# Patient Record
Sex: Male | Born: 1990
Health system: Southern US, Community
[De-identification: ages and names within clinical notes are randomized; demographics above are authoritative.]

## PROBLEM LIST (undated history)

## (undated) DIAGNOSIS — K37 Unspecified appendicitis: Secondary | ICD-10-CM

## (undated) HISTORY — PX: APPENDECTOMY: SHX54

---
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
## 2009-07-09 IMAGING — CT CT PELVIS W/ CM
2 of 5 series · 17 of 46 positions shown, 19 images · IV contrast (APPLIED)
Comparison: None

CT ABDOMEN

CLINICAL DATA: Evaluate for appendicitis

CT ABDOMEN AND PELVIS WITH CONTRAST
TECHNIQUE: Multidetector CT imaging of the abdomen and pelvis was
performed using the standard protocol following bolus
administration of intravenous contrast.
Contrast: 125/7 125 ml omni 300

[Series 2: abd_pel 5.0 b40f st · axial · 0.56mm/px · z∈[-400,-45]mm · 14 of 79 slices shown, 16 images]
[im 4/79  soft-tissue]
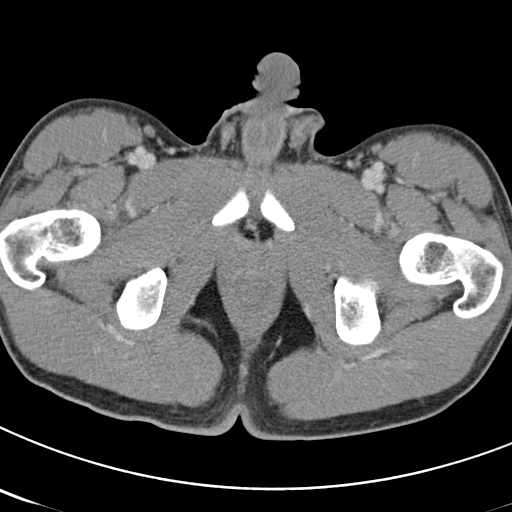
[im 4/79  bone]
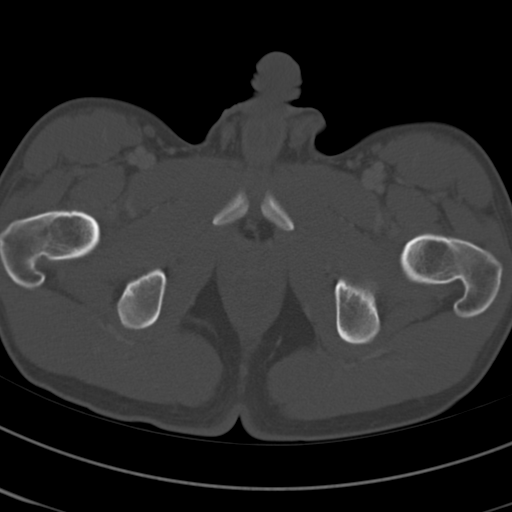
[im 12/79  soft-tissue]
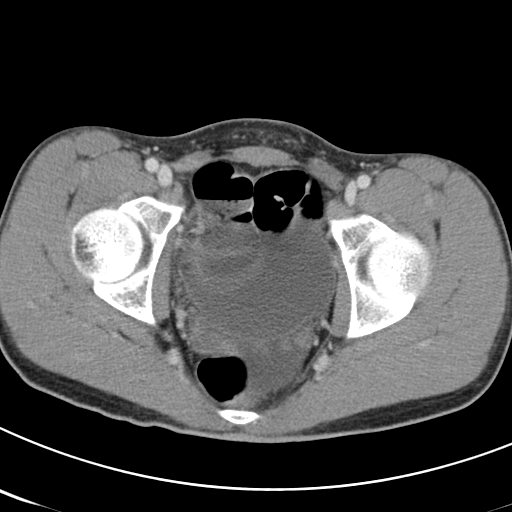
[im 16/79  soft-tissue]
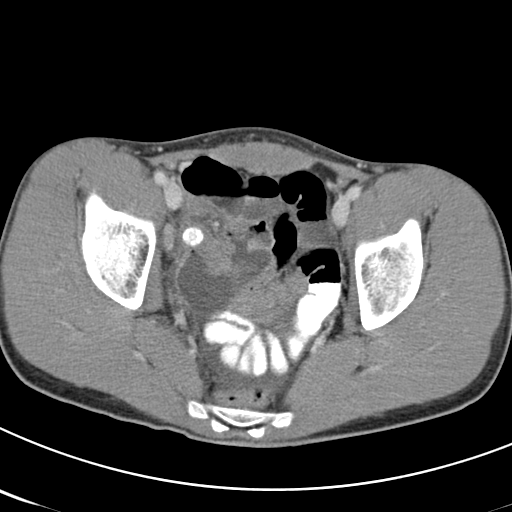
[im 20/79  soft-tissue]
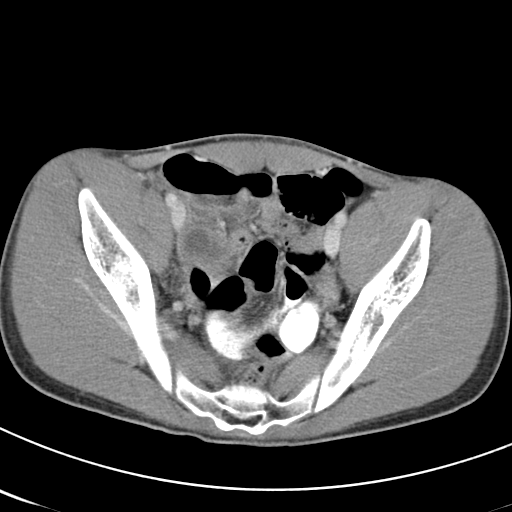
[im 28/79  soft-tissue]
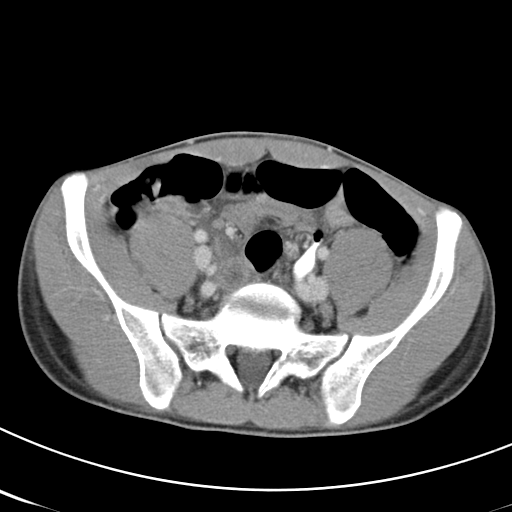
[im 32/79  soft-tissue]
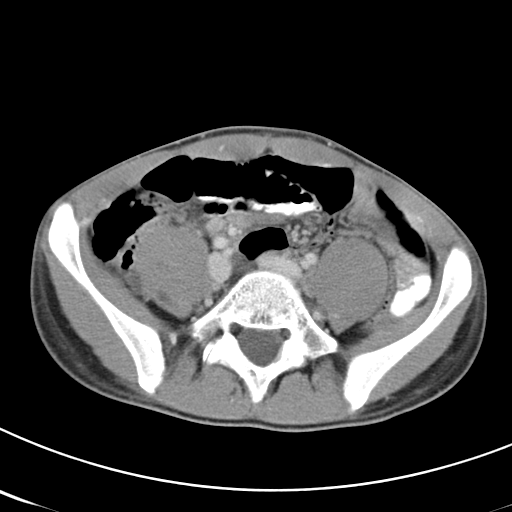
[im 36/79  soft-tissue]
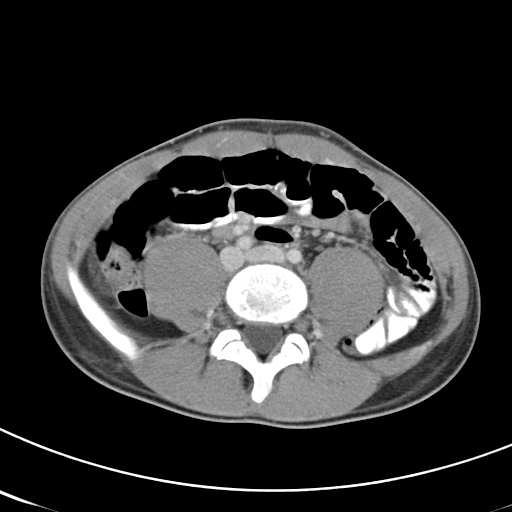
[im 43/79  soft-tissue]
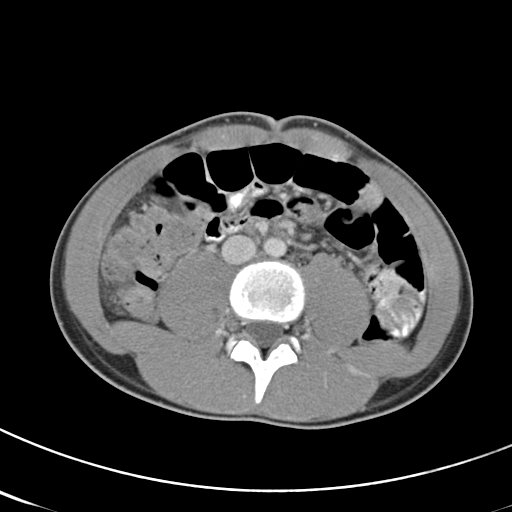
[im 47/79  soft-tissue]
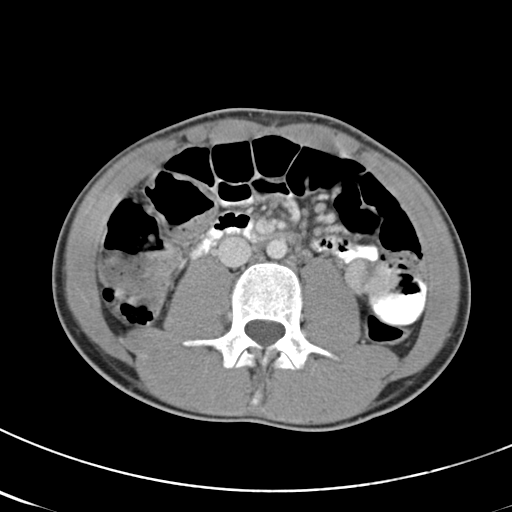
[im 47/79  bone]
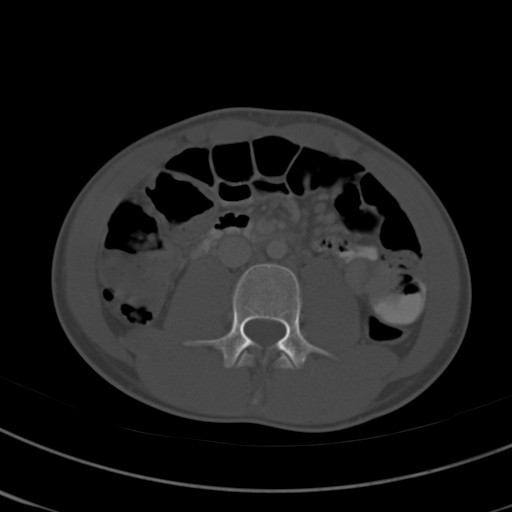
[im 51/79  soft-tissue]
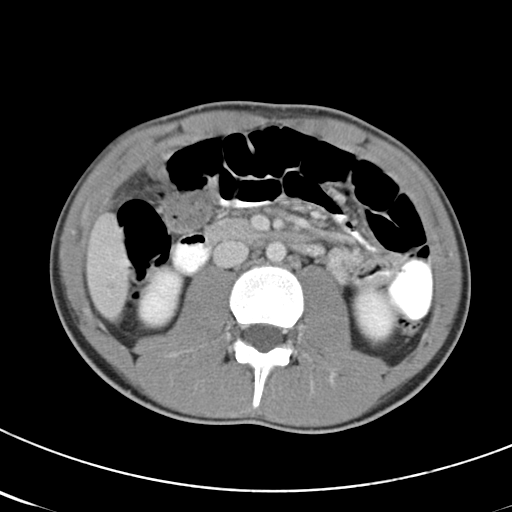
[im 59/79  soft-tissue]
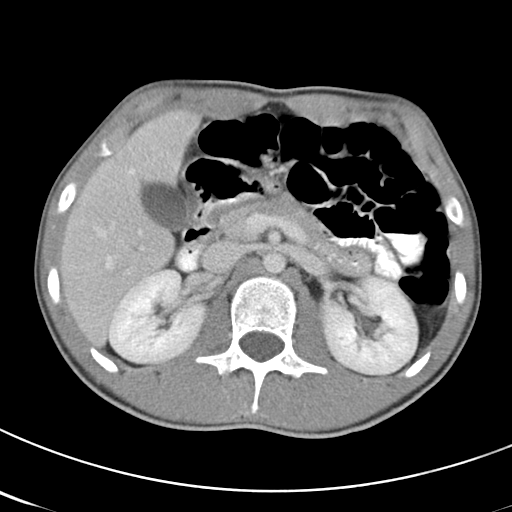
[im 63/79  soft-tissue]
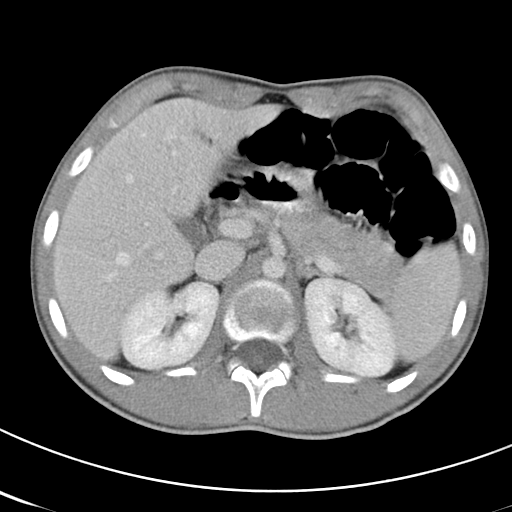
[im 67/79  soft-tissue]
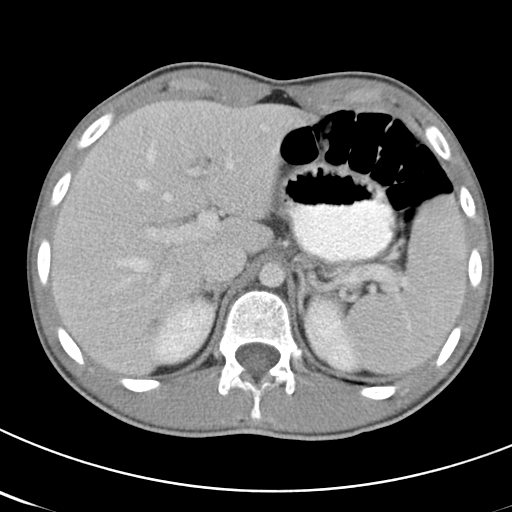
[im 75/79  soft-tissue]
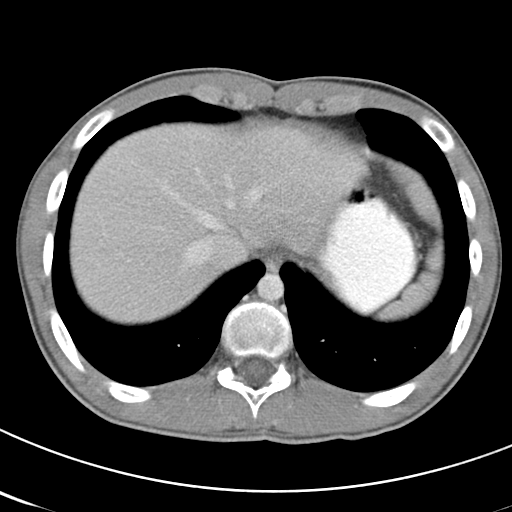

[Series 602: coronal abdomen · coronal · 0.80mm/px · 3 of 103 slices shown]
[im 35/103  soft-tissue]
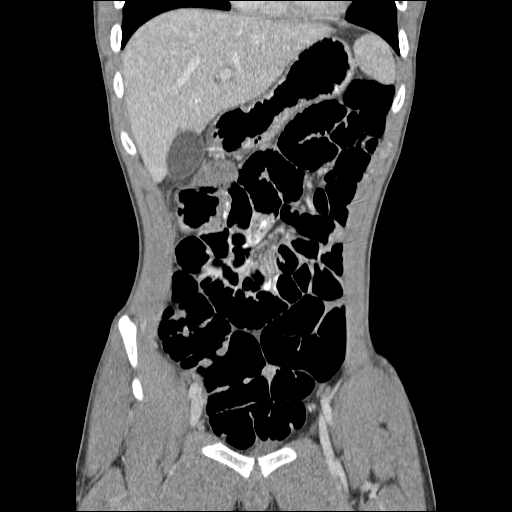
[im 46/103  soft-tissue]
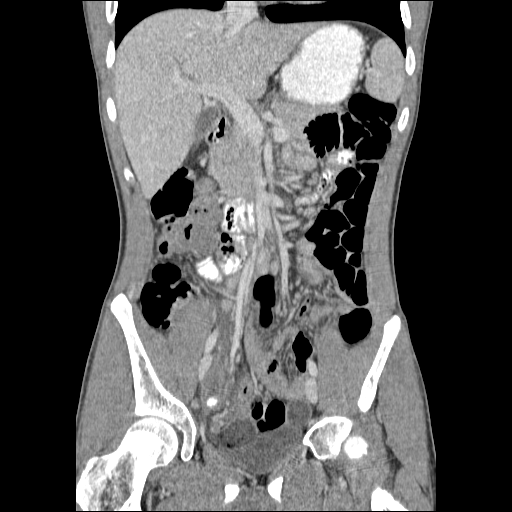
[im 57/103  soft-tissue]
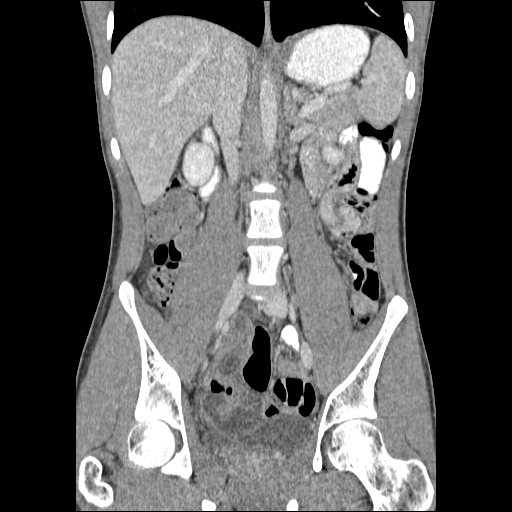

[17 of 46 positions shown; findings below may reference images not displayed]

FINDINGS: Visualized lung bases are clear.

Liver is normal.

Spleen is normal.

The pancreas is normal.

The adrenal glands are normal.

 The kidneys are normal.

Gallbladder is unremarkable by CT.

No biliary ductal dilation.

Stomach and visualized large and small bowel are unremarkable.

Abdominal aorta normal is in caliber.

No significant lymphadenopathy.

No free fluid or abnormal fluid collections..
IMPRESSION: No acute findings

CT PELVIS
FINDINGS: The appendix is diffusely dilated, fluid-filled and
contains a large appendicolith.  A moderate to marked amount of
free fluid is identified within the pelvis.  There is a low density
fluid collection posterior to the appendix within the right hemi
pelvis measuring approximately 4.2 x 3.6 cm.

The urinary bladder is negative.

Large bowel loops are unremarkable.

There is no adenopathy.

Review of the visualized osseous structures is unremarkable.
IMPRESSION: 1.  Acute appendicitis.
2.  Moderate amount of free fluid within the pelvis. I cannot rule
out a perforated appendicitis.  Additionally, there is a low
density fluid collection posterior to the appendix which may
represent a localized fluid collection/abscess.

## 2009-08-01 ENCOUNTER — Emergency Department (HOSPITAL_COMMUNITY): Admission: EM | Admit: 2009-08-01 | Discharge: 2009-08-01 | Payer: Self-pay | Admitting: Emergency Medicine

## 2010-09-09 LAB — URINALYSIS, ROUTINE W REFLEX MICROSCOPIC
Bilirubin Urine: NEGATIVE
Glucose, UA: NEGATIVE mg/dL
Hgb urine dipstick: NEGATIVE
Specific Gravity, Urine: 1.009 (ref 1.005–1.030)
pH: 6 (ref 5.0–8.0)

## 2010-09-09 LAB — BASIC METABOLIC PANEL
Calcium: 7.8 mg/dL — ABNORMAL LOW (ref 8.4–10.5)
Chloride: 97 mEq/L (ref 96–112)
Creatinine, Ser: 1.02 mg/dL (ref 0.4–1.5)
GFR calc Af Amer: >60 mL/min (ref >60)
Glucose, Bld: 106 mg/dL — ABNORMAL HIGH (ref 70–99)
Sodium: 129 mEq/L — ABNORMAL LOW (ref 135–145)

## 2010-09-09 LAB — DIFFERENTIAL
Basophils Absolute: 0 10*3/uL (ref 0.0–0.1)
Eosinophils Absolute: 0 10*3/uL (ref 0.0–0.7)
Eosinophils Relative: 0 % (ref 0–5)
Lymphs Abs: 0.8 10*3/uL (ref 0.7–4.0)

## 2010-09-09 LAB — CBC
MCHC: 34.3 g/dL (ref 30.0–36.0)
MCV: 84.5 fL (ref 78.0–100.0)
RDW: 13.3 % (ref 11.5–15.5)

## 2010-11-03 NOTE — H&P (Signed)
Paul Montes, Paul Montes              ACCOUNT NO.:  [id number]   MEDICAL RECORD NO.:  [id number]          PATIENT TYPE:  INP   LOCATION:  0104                         FACILITY:  [facility]   PHYSICIAN:  Adolph Pollack, M.D.DATE OF BIRTH:  1991-06-08   DATE OF ADMISSION:  01/07/2008  DATE OF DISCHARGE:                              HISTORY & PHYSICAL   CHIEF COMPLAINT:  Lower abdominal pain.   HISTORY OF PRESENT ILLNESS:  This is a 20 year old male who awoke about  3:00 in the morning of July 17 with lower abdominal pain.  The pain  persisted and was associated with intermittent fevers and chills.  He  had a small bowel movement.  No nausea or vomiting.  The pain got bad  enough that he presented to the emergency department today at  approximately 10:15 a.m.  He was noted on laboratory data to have  elevation of white blood cell count and was sent for a CT scan.  This  demonstrated findings consistent with acute appendicitis with possible  rupture.  There was a fluid collection in the deep pelvis concerning for  possible abscess versus reactive fluid.  I was asked to see him at that  time.   PAST MEDICAL HISTORY:  No chronic illnesses.   PAST SURGICAL HISTORY:  Previous operations none.   ALLERGIES:  PENICILLIN causes rash and hives.   MEDICATIONS:  Ibuprofen here for the pain recently.   SOCIAL HISTORY:  Lives at home with mother and father.  Not working this  summer.  No tobacco or alcohol use.   FAMILY HISTORY:  No chronic illnesses in his mother or father.   REVIEW OF SYSTEMS:  CARDIAC:  No hypertension or heart disease.  PULMONARY:  No asthma or pneumonia.  GI:  No peptic ulcer disease.  He  states he can only eat food in small amounts because he gets full  easily.  GU:  No kidney stones.  ENDOCRINE:  No diabetes.  HEMATOLOGIC:  No bleeding disorders or blood  clots.  NEUROLOGIC:  No seizures.   PHYSICAL EXAMINATION:  GENERAL:  A thin, ill-appearing male.  VITAL SIGNS:   Temperature is 98.5, blood pressure is 110/76, pulse 105,  respiratory rate 18, O2 saturations 99% on room air.  EYES:  Extraocular motions intact.  No icterus.  NECK:  Is supple without any palpable masses.  RESPIRATORY:  Breath sounds equal and clear respirations unlabored.  CARDIOVASCULAR:  Increased rate with regular rhythm.  ABDOMEN:  Abdomen is slightly firm.  He has some diffuse mild to  moderate lower abdominal tenderness.  No palpable masses.  No hernias.  MUSCULOSKELETAL:  He has adequate muscle tone.  No edema.  SKIN:  Pale but no jaundice.   White blood cell count 16,700, hemoglobin 16.0.  CT scan was reviewed.   IMPRESSION:  Acute appendicitis with possible perforation and deep  pelvic abscess.   PLAN:  Laparoscopic, possible open appendectomy.  I have discussed the  procedure, rationale and risks with him and his parents.  Risks  including but not limited to bleeding, infection, wound healing  problems, anesthesia,  accidental damage to intra-abdominal organs (such  as bladder, intestine, etc.) and potential increase for recurrent  infections.  They seem to understand this and agree with the plan.      Adolph Pollack, M.D.  Electronically Signed     TJR/MEDQ  D:  01/07/2008  T:  01/07/2008  Job:  [id number]

## 2010-11-03 NOTE — Op Note (Signed)
Paul Montes, Paul Montes              ACCOUNT NO.:  [id number]   MEDICAL RECORD NO.:  [id number]          PATIENT TYPE:  INP   LOCATION:  1540                         FACILITY:  [facility]   PHYSICIAN:  Adolph Pollack, M.D.DATE OF BIRTH:  12/06/1990   DATE OF PROCEDURE:  01/07/2008  DATE OF DISCHARGE:                               OPERATIVE REPORT   PREOP DIAGNOSIS:  Acute appendicitis.   POSTOP DIAGNOSIS:  Acute appendicitis.   PROCEDURE:  Laparoscopic appendectomy.   SURGEON:  Adolph Pollack, M.D.   ANESTHESIA:  General.   INDICATION:  This 20 year old male came to emergency department  complaining of worsening lower abdominal pain, some fever and chills and  a CT scan consistent with acute appendicitis and a questionable rupture  and abscess.  He is now brought to the operating room for appendectomy.   TECHNIQUE:  He is brought to the operating room, placed supine on the  operating table and a general anesthetic was administered.  A Foley  catheter was placed.  The hair on the abdominal wall was clipped and the  area sterilely prepped and draped.  Marcaine was infiltrated in the  subumbilical region.  A subumbilical incision was then made through the  skin, subcutaneous tissue, fascial layers and peritoneum, entering the  peritoneal cavity under direct vision.  A pursestring suture of #0  Vicryl was placed around the fascial edges.  A Hasson trocar was  introduced into the peritoneal cavity and pneumoperitoneum created by  insufflation of CO2 gas.   Next, the laparoscope introduced.  I then placed a 5 mm trocar in the  left lower quadrant.  I looked in the pelvis and all I saw here was  cloudy fluid and no real abscess.  I drained the cloudy fluid by way of  suction.  I then visualized the appendix.  The tip of it was edematous  and inflamed but did not appear to be ruptured.  It was fairly densely  adherent to the retroperitoneum near where the ureter would be  traveling.  Using careful blunt dissection, I was able to lift it off  the retroperitoneum and then identified the ureter, I was able to keep  my plane of dissection anterior to this.  Then I added a 5 mm trocar to  the right upper quadrant.  I was able to use blunt dissection to  separate the appendix from its adherence to the distal ileum secondary  to inflammatory tissue.  I then divided the mesoappendix close to the  appendix with a harmonic scalpel.  The proximal appendix had some  significant attachments to the lateral sidewall which I divided with the  harmonic scalpel.  I was then able to get down to the base of the  appendix and its intersection with the cecum.  The mesoappendix was  quite thick.   I retracted the appendix anteriorly and amputated it off the cecum with  the Endo-GIA stapler.  I then placed the appendix in an Endopouch bag.   I then removed the appendix through the subumbilical incision, but I had  to enlarge the fascial  incision given the size of the appendix.  This  was then sent to pathology.   Following this, I replaced the 12 mm subumbilical trocar.  I then  copiously irrigated out the right lower quadrant pelvis area.  Some  bleeding was noted from the staple line which was controlled with  hemoclips.  I inspected the ureter and intestines and no injuries were  noted, the area was hemostatic and the staple line was solid.   I then evacuated the irrigation fluid as much as possible.  I removed  the subumbilical trocar and then closed the subumbilical fascial defect  with interrupted #0 Vicryl sutures using laparoscopic vision.  I then  removed the remaining trocars and released pneumoperitoneum.   The skin incisions were then closed with #4-0 Monocryl subcuticular  stitches followed by Steri-Strips and sterile dressings.  He tolerated  procedure well without any apparent complications and was taken to  recovery in satisfactory condition.      Adolph Pollack, M.D.  Electronically Signed     TJR/MEDQ  D:  01/07/2008  T:  01/07/2008  Job:  [id number]

## 2011-03-19 LAB — DIFFERENTIAL
Basophils Absolute: 0.1
Basophils Relative: 0
Lymphs Abs: 1.9

## 2011-03-19 LAB — CBC
Hemoglobin: 16
MCHC: 34.1
Platelets: 289
RBC: 5.58
RDW: 13.5

## 2011-03-19 LAB — URINALYSIS, ROUTINE W REFLEX MICROSCOPIC
Glucose, UA: NEGATIVE
Leukocytes, UA: NEGATIVE
Nitrite: NEGATIVE
Protein, ur: NEGATIVE
Specific Gravity, Urine: 1.025
Urobilinogen, UA: 0.2
pH: 5.5

## 2011-03-19 LAB — POCT I-STAT, CHEM 8
Calcium, Ion: 1.11 — ABNORMAL LOW
HCT: 50 — ABNORMAL HIGH
Hemoglobin: 17 — ABNORMAL HIGH
Sodium: 136
TCO2: 27

## 2011-11-24 ENCOUNTER — Encounter (HOSPITAL_COMMUNITY): Payer: Self-pay | Admitting: *Deleted

## 2011-11-24 ENCOUNTER — Emergency Department (HOSPITAL_COMMUNITY)
Admission: EM | Admit: 2011-11-24 | Discharge: 2011-11-24 | Disposition: A | Discharge status: Home / Self Care | Payer: 59 | Source: Home / Self Care | Attending: Emergency Medicine | Admitting: Emergency Medicine

## 2011-11-24 DIAGNOSIS — M5380 Other specified dorsopathies, site unspecified: Secondary | ICD-10-CM

## 2011-11-24 DIAGNOSIS — M62830 Muscle spasm of back: Secondary | ICD-10-CM

## 2011-11-24 HISTORY — DX: Unspecified appendicitis: K37

## 2011-11-24 LAB — POCT URINALYSIS DIP (DEVICE)
Hgb urine dipstick: NEGATIVE
Ketones, ur: 15 mg/dL — AB
Leukocytes, UA: NEGATIVE
Protein, ur: NEGATIVE mg/dL
Specific Gravity, Urine: 1.02 (ref 1.005–1.030)
Urobilinogen, UA: 0.2 mg/dL (ref 0.0–1.0)

## 2011-11-24 MED ORDER — METAXALONE 800 MG PO TABS: 800.0000 mg | ORAL_TABLET | Freq: Three times a day (TID) | ORAL | Status: AC

## 2011-11-24 MED ORDER — HYDROCODONE-ACETAMINOPHEN 5-325 MG PO TABS: 2.0000 | ORAL_TABLET | ORAL | Status: AC | PRN

## 2011-11-24 MED ORDER — MELOXICAM 15 MG PO TABS: 15.0000 mg | ORAL_TABLET | Freq: Every day | ORAL | Status: DC

## 2011-11-24 NOTE — Discharge Instructions (Signed)
[facility]: [address] [place] [address] [phone]  [facility]: [address] [place] [address]   [phone]  [facility]: [address] [place] [address] [phone]  [facility] : [address] [place] [address] [phone]  [facility]: [address] [place] [address] [phone]  [facility]: [address] [place] [address] [phone]  Dr. Oneal Grout [address] [facility] [place] [address] [phone]     Spasticity Spasticity is a condition in which certain muscles contract continuously. This causes stiffness or tightness of the muscles. It may interfere with movement, speech, and manner of walking. CAUSES  This condition is usually caused by damage to the portion of the brain or spinal cord that controls voluntary movement. It may occur in association with:  Spinal cord injury.   Multiple sclerosis.   Cerebral palsy.   Brain damage due to lack of oxygen.   Brain trauma.   Severe head injury.   Metabolic diseases such as:   Adrenoleukodystrophy.   ALS Truddie Hidden Gehrig's disease).   Phenylketonuria.  SYMPTOMS   Increased muscle tone (hypertonicity).   A series of rapid muscle contractions (clonus).   Exaggerated deep tendon reflexes.   Muscle spasms.   Involuntary crossing of the legs (scissoring).   Fixed joints.  The degree of spasticity varies. It ranges from mild muscle stiffness to severe, painful, and uncontrollable muscle spasms. It can interfere with rehabilitation in patients with certain disorders. It often interferes with daily activities. TREATMENT  Treatment may include:  Medications.   Physical therapy regimens. They may include muscle  stretching and range of motion exercises. These help prevent shrinkage or shortening of muscles. They also help reduce the severity of symptoms.   Surgery. This may be recommended for tendon release or to sever the nerve-muscle pathway.  PROGNOSIS  The outcome for those with spasticity depends on:  Severity of the spasticity.   Associated disorder(s).  Document Released: 05/28/2002 Document Revised: 05/27/2011 Document Reviewed: 06/07/2005 [facility] Patient Information 2012 [place], [place].

## 2011-11-24 NOTE — ED Notes (Signed)
Pt  Has  Symptoms  Of  r  Upper  Quadrant  Pain      Radiating  To  Back     And  r  Shoulder  Area    -  Symptoms   Started  Last    Pm  No  Injury         -        Some nausea  No  Vomiting        Ambulated  To  Room with  A  Fluid  Steady  Gait

## 2011-11-24 NOTE — ED Provider Notes (Signed)
History     CSN: [id number]  Arrival date & time 11/24/11  1114   First MD Initiated Contact with Patient 11/24/11 1126      Chief Complaint  Patient presents with  . Abdominal Pain    (Consider location/radiation/quality/duration/timing/severity/associated sxs/prior treatment) HPI Comments: Patient reports right sided mid thoracic pain starting last night. Radiates around to the midaxillary line. Describes as achy and similar to muscle spasms. Pain is worse with taking deep inspiration, torso rotation, lateral bending, bending forwards. Is not affected with eating or fasting. no nausea, vomiting, fevers, coughing, wheezing, abdominal pain. No rash. No constipation or diarrhea. No recent or remote history of injury to his neck or back. Does not recall any change in his physical activity. Took 400 mg ibuprofen with some relief yesterday. No history of cancer, IVDU, osteoporosis, prolonged steroid use.  Patient is a 21 y.o. male presenting with back pain. The history is provided by the patient. No language interpreter was used.  Back Pain  This is a new problem. The current episode started yesterday. The problem occurs constantly. The pain is associated with no known injury. The pain is present in the thoracic spine. The quality of the pain is described as aching and stabbing. The symptoms are aggravated by bending, twisting and certain positions. The pain is worse during the day. Pertinent negatives include no fever, no numbness, no abdominal pain, no abdominal swelling, no bowel incontinence, no bladder incontinence, no dysuria, no leg pain, no paresthesias, no tingling and no weakness. He has tried NSAIDs for the symptoms. The treatment provided moderate relief.    Past Medical History  Diagnosis Date  . Appendicitis     Past Surgical History  Procedure Date  . Appendectomy     History reviewed. No pertinent family history.  History  Substance Use Topics  . Smoking status: Not on  file  . Smokeless tobacco: Not on file  . Alcohol Use:       Review of Systems  Constitutional: Negative for fever.  Gastrointestinal: Negative for abdominal pain and bowel incontinence.  Genitourinary: Negative for bladder incontinence and dysuria.  Musculoskeletal: Positive for back pain.  Neurological: Negative for tingling, weakness, numbness and paresthesias.    Allergies  Penicillins  Home Medications   Current Outpatient Rx  Name Route Sig Dispense Refill  . HYDROCODONE-ACETAMINOPHEN 5-325 MG PO TABS Oral Take 2 tablets by mouth every 4 (four) hours as needed for pain. 20 tablet 0  . MELOXICAM 15 MG PO TABS Oral Take 1 tablet (15 mg total) by mouth daily. 14 tablet 0  . METAXALONE 800 MG PO TABS Oral Take 1 tablet (800 mg total) by mouth 3 (three) times daily. 21 tablet 0    BP 132/89  Pulse 100  Temp(Src) 98.6 F (37 C) (Oral)  Resp 12  SpO2 100%  Physical Exam  Nursing note and vitals reviewed. Constitutional: He is oriented to person, place, and time. He appears well-developed and well-nourished.  HENT:  Head: Normocephalic and atraumatic.  Eyes: Conjunctivae and EOM are normal.  Neck: Normal range of motion.  Cardiovascular: Normal rate, regular rhythm, normal heart sounds and intact distal pulses.   Pulmonary/Chest: Effort normal and breath sounds normal. No respiratory distress. He exhibits no tenderness.  Abdominal: Normal appearance and bowel sounds are normal. He exhibits no distension. There is no tenderness. There is no rigidity, no rebound, no guarding, no CVA tenderness, no tenderness at McBurney's point and negative Murphy's sign.  Musculoskeletal: Normal range  of motion.       Thoracic back: He exhibits tenderness and spasm. He exhibits normal range of motion, no bony tenderness and no deformity.       Back:       Painless full range of motion bilateral shoulders. Patient's symptoms are aggravated with active bending to the right, torso rotation.  No rash.  Neurological: He is alert and oriented to person, place, and time.  Skin: Skin is warm and dry.  Psychiatric: He has a normal mood and affect. His behavior is normal.    ED Course  Procedures (including critical care time)  Labs Reviewed  POCT URINALYSIS DIP (DEVICE) - Abnormal; Notable for the following:    Bilirubin Urine SMALL (*)    Ketones, ur 15 (*)    All other components within normal limits   No results found.   1. Muscle spasm of back     MDM  H&P most consistent with rhomboid/trapezial muscle spasm. Abdomen benign. No evidence of cholelithiasis, pneumonia. No evidence of infection, hematuria on udip. Will send home with muscle relaxants, NSAIDs, Norco as needed. He'll followup with a primary care physician of choice. Providing with local primary care resources.  Luiz Blare, MD 11/24/11 2122

## 2012-06-17 ENCOUNTER — Encounter (HOSPITAL_COMMUNITY): Payer: Self-pay | Admitting: Emergency Medicine

## 2012-06-17 ENCOUNTER — Emergency Department (HOSPITAL_COMMUNITY)
Admission: EM | Admit: 2012-06-17 | Discharge: 2012-06-17 | Disposition: A | Discharge status: Home / Self Care | Payer: 59 | Attending: Emergency Medicine | Admitting: Emergency Medicine

## 2012-06-17 DIAGNOSIS — R112 Nausea with vomiting, unspecified: Secondary | ICD-10-CM | POA: Insufficient documentation

## 2012-06-17 DIAGNOSIS — Z8719 Personal history of other diseases of the digestive system: Secondary | ICD-10-CM | POA: Insufficient documentation

## 2012-06-17 DIAGNOSIS — E860 Dehydration: Secondary | ICD-10-CM | POA: Insufficient documentation

## 2012-06-17 LAB — COMPREHENSIVE METABOLIC PANEL
ALT: 29 U/L (ref 0–53)
AST: 24 U/L (ref 0–37)
Albumin: 4.7 g/dL (ref 3.5–5.2)
Alkaline Phosphatase: 70 U/L (ref 39–117)
CO2: 23 mEq/L (ref 19–32)
Glucose, Bld: 112 mg/dL — ABNORMAL HIGH (ref 70–99)
Potassium: 3.7 mEq/L (ref 3.5–5.1)
Total Protein: 7.9 g/dL (ref 6.0–8.3)

## 2012-06-17 LAB — CBC WITH DIFFERENTIAL/PLATELET
Basophils Relative: 0 % (ref 0–1)
Eosinophils Absolute: 0 10*3/uL (ref 0.0–0.7)
Eosinophils Relative: 0 % (ref 0–5)
HCT: 50.4 % (ref 39.0–52.0)
Lymphocytes Relative: 6 % — ABNORMAL LOW (ref 12–46)
Lymphs Abs: 1.1 10*3/uL (ref 0.7–4.0)
MCH: 28.9 pg (ref 26.0–34.0)
Monocytes Absolute: 0.8 10*3/uL (ref 0.1–1.0)
Monocytes Relative: 4 % (ref 3–12)
Neutrophils Relative %: 89 % — ABNORMAL HIGH (ref 43–77)
RDW: 12.4 % (ref 11.5–15.5)
WBC: 17.8 10*3/uL — ABNORMAL HIGH (ref 4.0–10.5)

## 2012-06-17 MED ORDER — ONDANSETRON HCL 4 MG/2ML IJ SOLN
4.0000 mg | Freq: Once | INTRAMUSCULAR | Status: AC
  Administered 2012-06-17: 4 mg via INTRAVENOUS
  Filled 2012-06-17: qty 2

## 2012-06-17 MED ORDER — PANTOPRAZOLE SODIUM 40 MG IV SOLR
40.0000 mg | Freq: Once | INTRAVENOUS | Status: AC
  Administered 2012-06-17: 40 mg via INTRAVENOUS
  Filled 2012-06-17: qty 40

## 2012-06-17 MED ORDER — ONDANSETRON HCL 8 MG PO TABS: 8.0000 mg | ORAL_TABLET | Freq: Three times a day (TID) | ORAL | Status: DC | PRN

## 2012-06-17 MED ORDER — MORPHINE SULFATE 4 MG/ML IJ SOLN
4.0000 mg | Freq: Once | INTRAMUSCULAR | Status: AC
  Administered 2012-06-17: 4 mg via INTRAVENOUS
  Filled 2012-06-17: qty 1

## 2012-06-17 MED ORDER — SODIUM CHLORIDE 0.9 % IV BOLUS (SEPSIS)
1000.0000 mL | Freq: Once | INTRAVENOUS | Status: AC
  Administered 2012-06-17: 1000 mL via INTRAVENOUS

## 2012-06-17 NOTE — ED Notes (Signed)
MD at bedside. 

## 2012-06-17 NOTE — ED Notes (Signed)
Pt states that he has been throwing up since 0500 today.  Denies diarrhea.  No abd pain.  Pt's dad had same sx yesterday.

## 2012-06-17 NOTE — ED Notes (Signed)
Discussed HR of 110-120s with Dr. Denton Lank. No further orders. Pt stable to go home.

## 2012-06-17 NOTE — ED Provider Notes (Signed)
History     CSN: [id number]  Arrival date & time 06/17/12  1041   First MD Initiated Contact with Patient 06/17/12 1143      Chief Complaint  Patient presents with  . Nausea  . Emesis    (Consider location/radiation/quality/duration/timing/severity/associated sxs/prior treatment) The history is provided by the patient.  pt c/o nv onset early this am. Several episodes. Emesis clear to sl yellowish. No bloody or bilious emesis. Had loose bm today. No recent constipation. Intermittent mid abd crampy abd discomfort. No constant, focal abd pain. No fever or chills. Parent and gp w recently similar symptoms. No known bad food ingestion, no recent abx use, or travel. No cough or uri c/o. No gu c/o. Prior abd surgery includes appendectomy, having regular bms, no abd distension.      Past Medical History  Diagnosis Date  . Appendicitis     Past Surgical History  Procedure Date  . Appendectomy     History reviewed. No pertinent family history.  History  Substance Use Topics  . Smoking status: Never Smoker   . Smokeless tobacco: Not on file  . Alcohol Use: No      Review of Systems  Constitutional: Negative for fever.  HENT: Negative for sore throat and neck pain.   Eyes: Negative for redness.  Respiratory: Negative for cough and shortness of breath.   Cardiovascular: Negative for chest pain.  Gastrointestinal: Positive for vomiting.  Genitourinary: Negative for flank pain.  Musculoskeletal: Negative for back pain.  Skin: Negative for rash.  Neurological: Negative for headaches.  Hematological: Does not bruise/bleed easily.  Psychiatric/Behavioral: Negative for confusion.    Allergies  Penicillins  Home Medications  No current outpatient prescriptions on file.  BP 127/82  Pulse 113  Temp 98.7 F (37.1 C) (Oral)  Resp 16  SpO2 97%  Physical Exam  Nursing note and vitals reviewed. Constitutional: He is oriented to person, place, and time. He appears  well-developed and well-nourished. No distress.  HENT:  Head: Atraumatic.  Nose: Nose normal.  Mouth/Throat: Oropharynx is clear and moist.  Eyes: Conjunctivae normal are normal. No scleral icterus.  Neck: Neck supple. No tracheal deviation present.  Cardiovascular: Normal rate, regular rhythm, normal heart sounds and intact distal pulses.   Pulmonary/Chest: Effort normal and breath sounds normal. No accessory muscle usage. No respiratory distress.  Abdominal: Soft. Bowel sounds are normal. He exhibits no distension and no mass. There is no tenderness. There is no rebound and no guarding.  Genitourinary:       No cva tenderness  Musculoskeletal: Normal range of motion. He exhibits no edema and no tenderness.  Neurological: He is alert and oriented to person, place, and time.  Skin: Skin is warm and dry.  Psychiatric: He has a normal mood and affect.    ED Course  Procedures (including critical care time)  Results for orders placed during the hospital encounter of 06/17/12  CBC WITH DIFFERENTIAL      Component Value Range   WBC 17.8 (*) 4.0 - 10.5 K/uL   RBC 6.20 (*) 4.22 - 5.81 MIL/uL   Hemoglobin 17.9 (*) 13.0 - 17.0 g/dL   HCT 14.7  82.9 - 56.2 %   MCV 81.3  78.0 - 100.0 fL   MCH 28.9  26.0 - 34.0 pg   MCHC 35.5  30.0 - 36.0 g/dL   RDW 13.0  86.5 - 78.4 %   Platelets 319  150 - 400 K/uL   Neutrophils Relative 89 (*)  43 - 77 %   Neutro Abs 15.9 (*) 1.7 - 7.7 K/uL   Lymphocytes Relative 6 (*) 12 - 46 %   Lymphs Abs 1.1  0.7 - 4.0 K/uL   Monocytes Relative 4  3 - 12 %   Monocytes Absolute 0.8  0.1 - 1.0 K/uL   Eosinophils Relative 0  0 - 5 %   Eosinophils Absolute 0.0  0.0 - 0.7 K/uL   Basophils Relative 0  0 - 1 %   Basophils Absolute 0.0  0.0 - 0.1 K/uL  COMPREHENSIVE METABOLIC PANEL      Component Value Range   Sodium 138  135 - 145 mEq/L   Potassium 3.7  3.5 - 5.1 mEq/L   Chloride 102  96 - 112 mEq/L   CO2 23  19 - 32 mEq/L   Glucose, Bld 112 (*) 70 - 99 mg/dL    BUN 10  6 - 23 mg/dL   Creatinine, Ser 1.61  0.50 - 1.35 mg/dL   Calcium 9.4  8.4 - 09.6 mg/dL   Total Protein 7.9  6.0 - 8.3 g/dL   Albumin 4.7  3.5 - 5.2 g/dL   AST 24  0 - 37 U/L   ALT 29  0 - 53 U/L   Alkaline Phosphatase 70  39 - 117 U/L   Total Bilirubin 0.3  0.3 - 1.2 mg/dL   GFR calc non Af Amer >90  >90 mL/min   GFR calc Af Amer >90  >90 mL/min     MDM  Iv ns bolus. zofran iv. protonix iv. Morphine iv.   Reviewed nursing notes and prior charts for additional history.    Pt feels mildly improved. Additional iv ns bolus.   abd soft nt on recheck.  Recheck hr 84, rr 16. No recurrent emesis. Pt appears stable for d/c.         Suzi Roots, MD 06/17/12 [phone]

## 2013-01-22 ENCOUNTER — Other Ambulatory Visit: Payer: Self-pay | Admitting: Dermatology

## 2015-03-18 ENCOUNTER — Other Ambulatory Visit: Payer: Self-pay | Admitting: Family Medicine

## 2015-03-18 DIAGNOSIS — N50811 Right testicular pain: Secondary | ICD-10-CM

## 2016-02-21 DIAGNOSIS — H578 Other specified disorders of eye and adnexa: Secondary | ICD-10-CM | POA: Diagnosis not present

## 2016-05-14 DIAGNOSIS — R112 Nausea with vomiting, unspecified: Secondary | ICD-10-CM | POA: Diagnosis not present

## 2016-05-14 DIAGNOSIS — A084 Viral intestinal infection, unspecified: Secondary | ICD-10-CM | POA: Diagnosis not present

## 2017-04-14 ENCOUNTER — Encounter: Payer: Self-pay | Admitting: Family Medicine

## 2017-04-14 ENCOUNTER — Ambulatory Visit (INDEPENDENT_AMBULATORY_CARE_PROVIDER_SITE_OTHER): Payer: BLUE CROSS/BLUE SHIELD | Admitting: Family Medicine

## 2017-04-14 VITALS — BP 104/70 | HR 92 | Temp 98.2°F | Ht 67.0 in | Wt 132.0 lb

## 2017-04-14 DIAGNOSIS — F649 Gender identity disorder, unspecified: Secondary | ICD-10-CM | POA: Diagnosis not present

## 2017-04-14 DIAGNOSIS — E349 Endocrine disorder, unspecified: Secondary | ICD-10-CM

## 2017-04-14 MED ORDER — SPIRONOLACTONE 25 MG PO TABS: 25.0000 mg | ORAL_TABLET | Freq: Every day | ORAL | 3 refills | Status: DC

## 2017-04-14 NOTE — Patient Instructions (Signed)
Start the spironolactone daily. You may crush it into food (pudding, applesauce, pretty much anything) I will send you a note about your lab work  That you have drawn today. Please come at your conveneince in about a week (after starting the spironolactone) for a lab only visit. You do not need to fast for that.  I will see you back as we discussed --21st of next month. Please review the consent form I have given you and bring it back with you, Please call with any questions or issues. It was great to meet you!

## 2017-04-15 ENCOUNTER — Encounter: Payer: Self-pay | Admitting: Family Medicine

## 2017-04-15 NOTE — Assessment & Plan Note (Signed)
Review of trans-gender intake form. Scanned to chart. Discussion about feminizing therapy. He was to continue currently going by the name Paul Montes. Ultimately may change to [name]. Had some hesitations about spurlike time because he had an episode of dehydration requiring hospitalization related to the influenza virus a few years ago. We discussed at length. Informed consent model discussed. Spurlike time started. Baseline labs today. She'll get repeat potassium and creatinine level in about 1 week. When I see him back we'll discuss next steps. Greater than 50% of our 45 minute office visit was spent in counseling and education regarding these issues.

## 2017-04-15 NOTE — Progress Notes (Signed)
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 First age when gender identity did not seem to match sex given of birth was a 6 or less. Used to dressing his sisters close but knew he should not let his parents know that. For many years she thought that was just a "fetish". Currently a senior at [employer] Has been seeing a counselor there for about a year. This is been beneficial. Has helped him work through a lot of confusion. About a month ago he decided he wanted to pursue transition to trans-woman. Right now he is living is a male, continues to use his name given at birth. Does not plan to change that until he begins a more formal transition process such as hormone therapy. He believes it will be quite a shock to his family. He has discussed that with them. I told him, they will not kick him out of the family but there essentially not happy about his decision.  Generally has good health area one overnight hospitalization for dehydration related to the influenza virus. This concerns him about taking diuretic therapy.  Occasionally feels down and depressed but never suicidal. Since he started in therapy, the feelings of depression are much less. He is hopeful that this transition would significantly improve his self-image. Occasionally has some mild anxiety and that she is associated with some mild nausea. This happens to 3 times a year. He uses some ondansetron for that and it resolves quickly.  Does not use alcohol, no illicits. Does not exercise regularly. Occasionally takes over-the-counter ibuprofen.  Goals for hormone therapy included feminization of his body. He is aware of permanent effects of hormone treatment such as breast development and loss of fertility. Ultimately would like to consider vaginoplasty.  Support systems include his group of friends, he is also active in a [employer] group at school area  is therapist has been extremely supportive. He thinks his family ultimately will be supportive but they will probably have to go through a lot of "growing pains"  REVIEW OF SYSTEMS:  Review of Systems  Constitutional: Negative for activity chang; no  appetite change and no unexpected weight change.  Eyes: Negative for eye pain and no visual disturbance.  Neck: denies neck pain; no swallowing problems CV: No chest pain, no shortness of breath, no lower extremity edema. No change in exercise tolerance Respiratory: Negative for cough or wheezing.  No shortness of breath. Gastrointestinal: Negative for abdominal pain, no diarrhea and no  constipation.  Genitourinary: Negative for decreased urine volume and  no difficulty urinating.  Musculoskeletal: Negative for arthralgias. No muscle weakness. Skin: Negative for rash.  Psychiatric/Behavioral: Negative for behavioral problems; no sleep disturbance and no  agitation.     PERTINENT  PMH / PSH: I have reviewed the patient's medications, allergies, past medical and surgical history, smoking status and updated in the EMR as appropriate.   OBJECTIVE:   Vital signs reviewed. GENERAL: Well-developed, well-nourished, no acute distress. CARDIOVASCULAR: Regular rate and rhythm  LUNGS: Normal resting effort ABDOMEN: Soft positive bowel sounds NEURO: No gross focal neurological deficits. MSK: Movement of extremity x 4. PSYCHIATRIC: Alert and oriented 4. Affect is interactive. Good eye contact and neatly dressed. Speech is normal fluency in content. Recent and remote memory is intact. Judgment is normal. No hallucinations. No psychomotor retardation and no agitation.    ASSESSMENT / PLAN: Please see problem oriented charting for  details

## 2017-04-16 LAB — CMP14+EGFR
A/G RATIO: 2 (ref 1.2–2.2)
ALBUMIN: 4.3 g/dL (ref 3.5–5.5)
ALT: 25 IU/L (ref 0–44)
AST: 16 IU/L (ref 0–40)
Alkaline Phosphatase: 63 IU/L (ref 39–117)
BILIRUBIN TOTAL: 0.4 mg/dL (ref 0.0–1.2)
BUN / CREAT RATIO: 10 (ref 9–20)
BUN: 9 mg/dL (ref 6–20)
CALCIUM: 9.3 mg/dL (ref 8.7–10.2)
CHLORIDE: 103 mmol/L (ref 96–106)
CO2: 24 mmol/L (ref 20–29)
Creatinine, Ser: 0.87 mg/dL (ref 0.76–1.27)
GFR, EST AFRICAN AMERICAN: 138 mL/min/{1.73_m2} (ref >59)
GFR, EST NON AFRICAN AMERICAN: 119 mL/min/{1.73_m2} (ref >59)
GLOBULIN, TOTAL: 2.2 g/dL (ref 1.5–4.5)
Glucose: 87 mg/dL (ref 65–99)
POTASSIUM: 4.4 mmol/L (ref 3.5–5.2)
SODIUM: 142 mmol/L (ref 134–144)
TOTAL PROTEIN: 6.5 g/dL (ref 6.0–8.5)

## 2017-04-16 LAB — TESTOSTERONE, FREE, TOTAL, SHBG
SEX HORMONE BINDING: 45.7 nmol/L (ref 16.5–55.9)
Testosterone, Free: 12 pg/mL (ref 9.3–26.5)
Testosterone: 518 ng/dL (ref 264–916)

## 2017-04-16 LAB — CBC
HEMATOCRIT: 47.4 % (ref 37.5–51.0)
Hemoglobin: 16.3 g/dL (ref 13.0–17.7)
MCH: 28.2 pg (ref 26.6–33.0)
MCHC: 34.4 g/dL (ref 31.5–35.7)
MCV: 82 fL (ref 79–97)
PLATELETS: 321 10*3/uL (ref 150–379)
RBC: 5.79 x10E6/uL (ref 4.14–5.80)
RDW: 13.6 % (ref 12.3–15.4)
WBC: 5.7 10*3/uL (ref 3.4–10.8)

## 2017-04-16 LAB — HIV ANTIBODY (ROUTINE TESTING W REFLEX): HIV SCREEN 4TH GENERATION: NONREACTIVE

## 2017-04-16 LAB — ESTRADIOL: Estradiol: 20.6 pg/mL (ref 7.6–42.6)

## 2017-04-18 ENCOUNTER — Telehealth: Payer: Self-pay | Admitting: *Deleted

## 2017-04-18 NOTE — Telephone Encounter (Signed)
Patient states he recently started spironolactone on Friday and has noticed some cramping and constipation which her just figured was a normal side effect, however, he states he has also been experiencing bad anxiety since taking his. Says the last 2 nights have been really bad. Would like to speak with MD about this.

## 2017-04-19 ENCOUNTER — Encounter: Payer: Self-pay | Admitting: Family Medicine

## 2017-04-19 NOTE — Telephone Encounter (Signed)
Spoke with patient. Recommended he stop the  spironolactone for a day or 2 if he wants. I don't think his symptoms of anxiety are related to the medicine itself but more than active taking the medicine and traveling down this path. We spoke at length and answered all his questions. He'll follow-up as previously determined.

## 2017-05-02 ENCOUNTER — Other Ambulatory Visit: Payer: BLUE CROSS/BLUE SHIELD

## 2017-05-02 ENCOUNTER — Telehealth: Payer: Self-pay | Admitting: *Deleted

## 2017-05-02 DIAGNOSIS — F649 Gender identity disorder, unspecified: Secondary | ICD-10-CM

## 2017-05-02 DIAGNOSIS — E349 Endocrine disorder, unspecified: Secondary | ICD-10-CM

## 2017-05-02 NOTE — Telephone Encounter (Signed)
Pt lm on nurse line.  Wanting to know "what the side effects of the medication are".  Did not leave the name and wants to know if there is anything that needs to be done in preparation for appt on 05/11/17. Paul Montes, [name], CMA

## 2017-05-03 ENCOUNTER — Encounter: Payer: Self-pay | Admitting: Family Medicine

## 2017-05-03 LAB — BASIC METABOLIC PANEL
BUN/Creatinine Ratio: 11 (ref 9–20)
BUN: 10 mg/dL (ref 6–20)
CALCIUM: 9 mg/dL (ref 8.7–10.2)
CO2: 24 mmol/L (ref 20–29)
CREATININE: 0.93 mg/dL (ref 0.76–1.27)
Chloride: 100 mmol/L (ref 96–106)
GFR calc Af Amer: 130 mL/min/{1.73_m2} (ref >59)
GFR, EST NON AFRICAN AMERICAN: 112 mL/min/{1.73_m2} (ref >59)
Glucose: 87 mg/dL (ref 65–99)
POTASSIUM: 4.3 mmol/L (ref 3.5–5.2)
Sodium: 139 mmol/L (ref 134–144)

## 2017-05-11 ENCOUNTER — Other Ambulatory Visit: Payer: Self-pay

## 2017-05-11 ENCOUNTER — Ambulatory Visit (INDEPENDENT_AMBULATORY_CARE_PROVIDER_SITE_OTHER): Payer: BLUE CROSS/BLUE SHIELD | Admitting: Family Medicine

## 2017-05-11 ENCOUNTER — Encounter: Payer: Self-pay | Admitting: Family Medicine

## 2017-05-11 VITALS — BP 124/86 | HR 112 | Temp 98.3°F | Ht 67.0 in | Wt 130.8 lb

## 2017-05-11 DIAGNOSIS — F649 Gender identity disorder, unspecified: Secondary | ICD-10-CM | POA: Diagnosis not present

## 2017-05-11 DIAGNOSIS — Z789 Other specified health status: Secondary | ICD-10-CM

## 2017-05-11 DIAGNOSIS — F640 Transsexualism: Secondary | ICD-10-CM | POA: Diagnosis not present

## 2017-05-11 MED ORDER — ESTRADIOL VALERATE 10 MG/ML IM OIL: TOPICAL_OIL | INTRAMUSCULAR | 1 refills | Status: DC

## 2017-05-11 MED ORDER — ESTRADIOL 0.05 MG/24HR TD PTWK: 0.0500 mg | MEDICATED_PATCH | TRANSDERMAL | 1 refills | Status: DC

## 2017-05-11 NOTE — Patient Instructions (Signed)
As we have discussed, use EITHER the patch or the injectable estrogen. I will see you back in December! Great to see you!

## 2017-05-13 ENCOUNTER — Encounter: Payer: Self-pay | Admitting: Family Medicine

## 2017-05-13 DIAGNOSIS — F640 Transsexualism: Secondary | ICD-10-CM | POA: Insufficient documentation

## 2017-05-13 DIAGNOSIS — Z789 Other specified health status: Secondary | ICD-10-CM | POA: Insufficient documentation

## 2017-05-13 NOTE — Assessment & Plan Note (Signed)
Discussion again about risks of estrogen therapy and Paul Montes has no questions. He is very intent on using injectable estrogen despite my recommending tansdermal. I ended up giving him a rx for both as I am not sure his insurance will pay for injectable. He agrees to use only one of them. My nurse showed him how to give Im injection and after that he may have changed his mind. F/u 3 weeks Still going by Paul Montes

## 2017-05-13 NOTE — Progress Notes (Signed)
    CHIEF COMPLAINT / HPI:  Ready to start estrogen therapy. Has tolerated spironolactone well. No questions. Brings signed informed consent.  Wants to do injectable estrogen after reading extensively.  REVIEW OF SYSTEMS:  Pertinent review of systems: negative for fever or unusual weight change.   PERTINENT  PMH / PSH: I have reviewed the patient's medications, allergies, past medical and surgical history, smoking status and updated in the EMR as appropriate.   OBJECTIVE:   Vital signs reviewed. GENERAL: Well-developed, well-nourished, no acute distress. CARDIOVASCULAR: Regular rate and rhythm no murmur gallop or rub LUNGS: Clear to auscultation bilaterally, no rales or wheeze. ABDOMEN: Soft positive bowel sounds NEURO: No gross focal neurological deficits. MSK: Movement of extremity x 4. PSYCH: AXOX4. Affect interactive. Speech normal in fluency and content. Judgement normal. Recent and remote memory normal.   ASSESSMENT / PLAN: Please see problem oriented charting for details. Greater than 50% of our 25 minute office visit was spent in counseling and education regarding these issues.

## 2017-05-13 NOTE — Assessment & Plan Note (Signed)
Ready to start estrogen

## 2017-05-16 ENCOUNTER — Telehealth: Payer: Self-pay | Admitting: *Deleted

## 2017-05-16 NOTE — Telephone Encounter (Signed)
Spoke w Paul MalkinZach Normal bowel mvment yesterday and today. Has had this type of pain once or twice before---usually related to constipation. One time had similar pain only 13/10 and it was appendicitis. He is s/p appendectomy. Testicles are not red. They're very tender. The right one seems a little swollen which she is had occasionally in the past. He came home early from school and took some ibuprofen. He has less pain if he is lying down. He's had no fever. I gave him the option of coming in now for evaluation or current watching an overnight. He opted to stay at home. He will call in the morning with an update. If she's not doing well tonight or things get worse she'll call the on-call doctor and/or seen at the emergency department. [facility] be [facility]. He is amenable to this plan.  Since she's had this before and since there are no estrogen receptors in the scrotum, I don't think this is related all to the estradiol patch. He's been on the spironolactone for 3 weeks plus without problems at all think it's related to that. It sounded a lot like appendicitis but since he is status post appendectomy, that more or less rules that out although he could have an accessory appendix I suppose. He was able to eat a little bit of something earlier so I hope he'll get better overnight without any sequela.

## 2017-05-16 NOTE — Telephone Encounter (Signed)
Patient called in states he's been on estradiol patch for 5 days. Began with testicular pain last evening which has radiated to RLQ. Thought it was constipation pain but had normal BM last evening and again today with no relief or improvement. Rates pain 8-9/10. Please advise. Kinnie FeilL. Ducatte, RN, BSN

## 2017-05-17 NOTE — Telephone Encounter (Signed)
Pt lm on nurse line.  States that his symptoms are greatly improved as of this am. . Fleeger, [name], CMA

## 2017-05-17 NOTE — Telephone Encounter (Signed)
Pt calls rn line back this afternoon.  LM stating that his pain is back as of this afternoon and he would like an appt with Dr. Jennette KettleNeal. Fleeger, [name], CMA

## 2017-05-18 NOTE — Telephone Encounter (Signed)
Dear Paul AstersWhite Team See if you can work him in this AM or if not, colpo in AM [facility]! Paul LevySara Aeron Montes

## 2017-05-19 ENCOUNTER — Emergency Department (HOSPITAL_COMMUNITY): Payer: BLUE CROSS/BLUE SHIELD

## 2017-05-19 ENCOUNTER — Encounter (HOSPITAL_COMMUNITY): Payer: Self-pay | Admitting: Emergency Medicine

## 2017-05-19 ENCOUNTER — Telehealth: Payer: Self-pay | Admitting: *Deleted

## 2017-05-19 ENCOUNTER — Other Ambulatory Visit: Payer: Self-pay

## 2017-05-19 ENCOUNTER — Emergency Department (HOSPITAL_COMMUNITY)
Admission: EM | Admit: 2017-05-19 | Discharge: 2017-05-20 | Disposition: A | Discharge status: Home / Self Care | Payer: BLUE CROSS/BLUE SHIELD | Attending: Emergency Medicine | Admitting: Emergency Medicine

## 2017-05-19 ENCOUNTER — Ambulatory Visit (INDEPENDENT_AMBULATORY_CARE_PROVIDER_SITE_OTHER): Payer: BLUE CROSS/BLUE SHIELD | Admitting: Family Medicine

## 2017-05-19 VITALS — BP 108/68 | HR 90 | Temp 98.5°F | Wt 130.0 lb

## 2017-05-19 DIAGNOSIS — Z79899 Other long term (current) drug therapy: Secondary | ICD-10-CM | POA: Insufficient documentation

## 2017-05-19 DIAGNOSIS — N451 Epididymitis: Secondary | ICD-10-CM

## 2017-05-19 DIAGNOSIS — N5089 Other specified disorders of the male genital organs: Secondary | ICD-10-CM | POA: Diagnosis not present

## 2017-05-19 DIAGNOSIS — N5082 Scrotal pain: Secondary | ICD-10-CM

## 2017-05-19 DIAGNOSIS — N50819 Testicular pain, unspecified: Secondary | ICD-10-CM

## 2017-05-19 DIAGNOSIS — R1030 Lower abdominal pain, unspecified: Secondary | ICD-10-CM | POA: Diagnosis not present

## 2017-05-19 LAB — CBC WITH DIFFERENTIAL/PLATELET
BASOS PCT: 0 %
Basophils Absolute: 0 10*3/uL (ref 0.0–0.1)
EOS ABS: 0 10*3/uL (ref 0.0–0.7)
Eosinophils Relative: 0 %
HCT: 47.5 % (ref 39.0–52.0)
Hemoglobin: 16.4 g/dL (ref 13.0–17.0)
Lymphocytes Relative: 23 %
Lymphs Abs: 2.5 10*3/uL (ref 0.7–4.0)
MCH: 28.4 pg (ref 26.0–34.0)
MCHC: 34.5 g/dL (ref 30.0–36.0)
MCV: 82.2 fL (ref 78.0–100.0)
MONO ABS: 0.7 10*3/uL (ref 0.1–1.0)
MONOS PCT: 6 %
Neutro Abs: 7.9 10*3/uL — ABNORMAL HIGH (ref 1.7–7.7)
Neutrophils Relative %: 71 %
Platelets: 322 10*3/uL (ref 150–400)
RBC: 5.78 MIL/uL (ref 4.22–5.81)
RDW: 12.6 % (ref 11.5–15.5)
WBC: 11.1 10*3/uL — ABNORMAL HIGH (ref 4.0–10.5)

## 2017-05-19 LAB — URINALYSIS, ROUTINE W REFLEX MICROSCOPIC
Bacteria, UA: NONE SEEN
Bilirubin Urine: NEGATIVE
GLUCOSE, UA: NEGATIVE mg/dL
Ketones, ur: 20 mg/dL — AB
Leukocytes, UA: NEGATIVE
NITRITE: NEGATIVE
Protein, ur: NEGATIVE mg/dL
SPECIFIC GRAVITY, URINE: 1.017 (ref 1.005–1.030)
Squamous Epithelial / LPF: NONE SEEN
pH: 5 (ref 5.0–8.0)

## 2017-05-19 MED ORDER — IBUPROFEN 800 MG PO TABS: 800.0000 mg | ORAL_TABLET | Freq: Three times a day (TID) | ORAL | 1 refills | Status: DC | PRN

## 2017-05-19 MED ORDER — AZITHROMYCIN 500 MG PO TABS
2000.0000 mg | ORAL_TABLET | Freq: Once | ORAL | Status: AC
  Administered 2017-05-19: 2000 mg via ORAL

## 2017-05-19 MED ORDER — CEFTRIAXONE SODIUM 250 MG IJ SOLR
250.0000 mg | Freq: Once | INTRAMUSCULAR | Status: AC
  Administered 2017-05-19: 250 mg via INTRAMUSCULAR

## 2017-05-19 MED ORDER — MORPHINE SULFATE (PF) 4 MG/ML IV SOLN: 2.0000 mg | Freq: Once | INTRAVENOUS | Status: DC

## 2017-05-19 MED ORDER — AZITHROMYCIN 500 MG PO TABS: 500.0000 mg | ORAL_TABLET | Freq: Once | ORAL | Status: DC

## 2017-05-19 NOTE — Telephone Encounter (Signed)
Called patient to make an appointment and received no answer. Left voice message that if he was still having issues and needed to see Dr. Jennette KettleNeal, he could call in to schedule and appointment.Glennie HawkSimpson, Holy [place] R

## 2017-05-19 NOTE — ED Provider Notes (Signed)
[facility] Provider Note   CSN: [id number] Arrival date & time: 05/19/17  1912     History   Chief Complaint Chief Complaint  Patient presents with  . Testicle Pain    HPI Paul Montes is a 26 y.o. adult.  Patient is a [age] year old male with history of transgender is him currently taking hormone therapy.  He presents for evaluation of lower abdominal and testicle pain that has been worsening over the past week.  This began in the absence of any injury or trauma.  He denies any dysuria, hematuria, fevers, or chills.  Denies any bowel or bladder complaints.  He was seen earlier this week and treated for possible STD even though he tells me he is sexually inactive.  This did not help.   The history is provided by the patient.  Testicle Pain  This is a new problem. Episode onset: 1 week ago. The problem occurs constantly. The problem has been gradually worsening. Associated symptoms include abdominal pain. Nothing aggravates the symptoms. Nothing relieves the symptoms. She has tried nothing for the symptoms. The treatment provided no relief.    Past Medical History:  Diagnosis Date  . Appendicitis     Patient Active Problem List   Diagnosis Date Noted  . 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 Gender dysphoria 04/14/2017    Past Surgical History:  Procedure Laterality Date  . APPENDECTOMY         Home Medications    Prior to Admission medications   Medication Sig Start Date End Date Taking? Authorizing Provider  estradiol (CLIMARA - DOSED IN MG/24 HR) 0.05 mg/24hr patch Place 1 patch (0.05 mg total) onto the skin once a week. 05/11/17  Yes Nestor RampNeal, Sara L, MD  spironolactone (ALDACTONE) 25 MG tablet Take 1 tablet (25 mg total) by mouth daily. 04/14/17  Yes Nestor RampNeal, Sara L, MD  Estradiol Valerate 10 MG/ML OIL Inject 1 ml into muscle every 2 weeks as directed Patient not taking: Reported on 05/19/2017 05/11/17   Nestor RampNeal, Sara  L, MD  ibuprofen (ADVIL,MOTRIN) 800 MG tablet Take 1 tablet (800 mg total) by mouth every 8 (eight) hours as needed. Patient not taking: Reported on 05/19/2017 05/19/17   Nestor RampNeal, Sara L, MD  ondansetron (ZOFRAN) 8 MG tablet Take 1 tablet (8 mg total) by mouth every 8 (eight) hours as needed for nausea. Patient not taking: Reported on 05/19/2017 06/17/12   Cathren LaineSteinl, Kevin, MD    Family History History reviewed. No pertinent family history.  Social History Social History   Tobacco Use  . Smoking status: Never Smoker  . Smokeless tobacco: Never Used  Substance Use Topics  . Alcohol use: No  . Drug use: No     Allergies   Penicillins   Review of Systems Review of Systems  Gastrointestinal: Positive for abdominal pain.  Genitourinary: Positive for testicular pain.  All other systems reviewed and are negative.    Physical Exam Updated Vital Signs BP (!) 141/85   Pulse (!) 101   Temp 98.2 F (36.8 C) (Oral)   Resp 19   Ht 5\' 7"  (1.702 m)   Wt 59 kg (130 lb)   SpO2 100%   BMI 20.36 kg/m   Physical Exam  Constitutional: She is oriented to person, place, and time. She appears well-developed and well-nourished. No distress.  HENT:  Head: Normocephalic and atraumatic.  Neck: Normal range of motion. Neck supple.  Cardiovascular: Normal rate and regular rhythm. Exam reveals  no gallop and no friction rub.  No murmur heard. Pulmonary/Chest: Effort normal and breath sounds normal. No respiratory distress. She has no wheezes.  Abdominal: Soft. Bowel sounds are normal. She exhibits no distension. There is no tenderness.  Genitourinary: Penis normal. No penile tenderness.  Genitourinary Comments: External genitalia appears within normal limits.  There is no swelling, redness, or lesions.  The testicles are freely mobile within the scrotum.  There is tenderness to palpation of the testicles.  Musculoskeletal: Normal range of motion.  Neurological: She is alert and oriented to  person, place, and time.  Skin: Skin is warm and dry. She is not diaphoretic.  Nursing note and vitals reviewed.    ED Treatments / Results  Labs (all labs ordered are listed, but only abnormal results are displayed) Labs Reviewed  CBC WITH DIFFERENTIAL/PLATELET - Abnormal; Notable for the following components:      Result Value   WBC 11.1 (*)    Neutro Abs 7.9 (*)    All other components within normal limits  URINALYSIS, ROUTINE W REFLEX MICROSCOPIC - Abnormal; Notable for the following components:   Hgb urine dipstick MODERATE (*)    Ketones, ur 20 (*)    All other components within normal limits  COMPREHENSIVE METABOLIC PANEL  LIPASE, BLOOD    EKG  EKG Interpretation None       Radiology US Scrotum  Result Date: 05/19/2017 CLINICAL DATA:  Initial evaluation for acute right greater left scrotal pain for 4 days. EXAM: SCROTAL ULTRASOUND DOPPLER ULTRASOUND OF THE TESTICLES TECHNIQUE: Complete ultrasound examination of the testicles, epididymis, and other scrotal structures was performed. Color and spectral Doppler ultrasound were also utilized to evaluate blood flow to the testicles. COMPARISON:  None. FINDINGS: Right testicle Measurements: 3.5 x 2.1 x 2.8 cm. No mass or microlithiasis visualized. Left testicle Measurements: 3.4 x 1.9 x 2.6 cm. No mass or microlithiasis visualized. Right epididymis: Normal in size and appearance. Incidental note made of a 2.7 mm simple cyst at the right epididymal head. This is of doubtful significance. Left epididymis: Normal in size and appearance. Few small epididymal cysts measuring approximately 3 mm present at the left epididymal head, also of doubtful significance. Hydrocele:  None visualized. Varicocele:  None visualized. Pulsed Doppler interrogation of both testes demonstrates normal low resistance arterial and venous waveforms bilaterally. IMPRESSION: Negative testicular ultrasound. No acute abnormality identified. No evidence for torsion.  Electronically Signed   By: Rise Mu M.D.   On: 05/19/2017 20:56   [place] Scrotom Doppler  Result Date: 05/19/2017 CLINICAL DATA:  Initial evaluation for acute right greater left scrotal pain for 4 days. EXAM: SCROTAL ULTRASOUND DOPPLER ULTRASOUND OF THE TESTICLES TECHNIQUE: Complete ultrasound examination of the testicles, epididymis, and other scrotal structures was performed. Color and spectral Doppler ultrasound were also utilized to evaluate blood flow to the testicles. COMPARISON:  None. FINDINGS: Right testicle Measurements: 3.5 x 2.1 x 2.8 cm. No mass or microlithiasis visualized. Left testicle Measurements: 3.4 x 1.9 x 2.6 cm. No mass or microlithiasis visualized. Right epididymis: Normal in size and appearance. Incidental note made of a 2.7 mm simple cyst at the right epididymal head. This is of doubtful significance. Left epididymis: Normal in size and appearance. Few small epididymal cysts measuring approximately 3 mm present at the left epididymal head, also of doubtful significance. Hydrocele:  None visualized. Varicocele:  None visualized. Pulsed Doppler interrogation of both testes demonstrates normal low resistance arterial and venous waveforms bilaterally. IMPRESSION: Negative testicular ultrasound. No acute  abnormality identified. No evidence for torsion. Electronically Signed   By: Rise MuBenjamin  McClintock M.D.   On: 05/19/2017 20:56    Procedures Procedures (including critical care time)  Medications Ordered in ED Medications  morphine 4 MG/ML injection 2 mg (not administered)     Initial Impression / Assessment and Plan / ED Course  I have reviewed the triage vital signs and the nursing notes.  Pertinent labs & imaging results that were available during my care of the patient were reviewed by me and considered in my medical decision making (see chart for details).  Patient's workup reveals no obvious cause for his testicular pain.  He has no torsion or sign of  epididymitis on his ultrasound and no evidence for kidney stone on his renal CT.  He does have moderate blood on his urine dipstick and I am uncertain of the significance of this.  He will be treated for presumed epididymitis with Cipro and follow-up with primary doctor if not improving.  Final Clinical Impressions(s) / ED Diagnoses   Final diagnoses:  Scrotal pain    ED Discharge Orders    None       Geoffery Lyonselo, Truman Aceituno, MD 05/20/17 [phone]

## 2017-05-19 NOTE — Progress Notes (Signed)
error 

## 2017-05-19 NOTE — ED Provider Notes (Signed)
MSE was initiated and I personally evaluated the patient and placed orders (if any) at  8:03 PM on May 19, 2017.   26 year old male transitioning to male presents today with complaints of testicular pain.  Patient reports a 2-day history of bilateral testicular pain worse on the right.  Patient notes exquisite tenderness to palpation of both testicles.  He notes radiation of symptoms up into his abdomen.  Patient notes this is very severe.  He reports some urinary urgency but difficulty urinating.  Patient was seen by primary care this morning for this given ceftriaxone injection and azithromycin for reported epididymitis.  Patient has bilateral descended testicles exquisitely tender to palpation, no signs of scrotal infection.  Patient's abdomen tender to palpation mid lower.   Patient will need testicular ultrasound to rule out torsion.  Question epididymitis in this patient.  Patient having slightly more abdominal pain and would be expected.  Patient will need an acute bed, labs and imaging ordered.    Surgical history includes appendectomy.  Vitals:   05/19/17 1940  BP: 139/81  Pulse: (!) 102  Resp: 19  Temp: 98.1 F (36.7 C)  SpO2: 98%      Eyvonne MechanicHedges, Irving Bloor, PA-C 05/19/17 2004    Pricilla LovelessGoldston, Scott, MD 05/19/17 [id number]

## 2017-05-19 NOTE — Telephone Encounter (Signed)
Patient left message on nurse line requesting return call from PCP. States after today's OV he's been having "pure water diarrhea". Wants to know if he should be worried. Kinnie FeilL. Ducatte, RN, BSN

## 2017-05-19 NOTE — ED Triage Notes (Signed)
Pt c/o 9/10 testicular pain since last Sunday, pt states his lower abd feels like bloated and he constantly feel like urgency to use the bathroom and unable to use it. No fever or chills.

## 2017-05-19 NOTE — ED Notes (Signed)
Pt to CT

## 2017-05-19 NOTE — Patient Instructions (Addendum)
Please call the office in the morning and leave me a message update. If you have problems after hours please call the office number to talk with the on call physician. I have sent in your 800 mg ibuprofen---use one every 8 hours. Max of 3 in a 24 hour period is the maximum daily dose of ibuprofen. Take it with  Food if you can.

## 2017-05-20 MED ORDER — TRAMADOL HCL 50 MG PO TABS: 50.0000 mg | ORAL_TABLET | Freq: Four times a day (QID) | ORAL | 0 refills | Status: DC | PRN

## 2017-05-20 MED ORDER — CIPROFLOXACIN HCL 500 MG PO TABS: 500.0000 mg | ORAL_TABLET | Freq: Two times a day (BID) | ORAL | 0 refills | Status: DC

## 2017-05-20 NOTE — Progress Notes (Signed)
    CHIEF COMPLAINT / HPI:   Bilateral testicular pain now for 3 or 4 days. Has been waxing and waning a little bit. He has had this type of issue once before but never to this severity. Several years ago he had testicular pain that resolved after about 2 days and was only about half is painful cyst. No new sexual encounters, in fact has not been sexually active in many many months. No penile discharge. Lower abdominal pain feels like he is constipated but he is having regular bowel movements. Testicles are less painful if he is lying down or if he elevates them.  REVIEW OF SYSTEMS:  See history of present illness. Additionally no fever.  PERTINENT  PMH / PSH: I have reviewed the patient's medications, allergies, past medical and surgical history, smoking status and updated in the EMR as appropriate. Recently started estrogen therapy for gender dysphoria.   OBJECTIVE:  GEN.: Well-developed no acute distress  GU: Penis is normal. No sign of lesions. No penile discharge. Nontender. Scrotum appears normal. He is tender to palpation diffusely. He is particular tender over the right epididymis area I feel no nodularity or mass here. The testicles are not swollen, they are normal size. The does not appear to be any scrotal excess fluid. SKIN: GU area skin is without any sign of rash or lesion.  ASSESSMENT / PLAN: Scrotal pain most consistent with epididymo- orchitis. He has had this once before but not quite to this severity. I do not think this is related in any way to his hormone therapy. Will treat with ceftriaxone and azithromycin. He will follow-up with me tomorrow by phone. Also gave him some ibuprofen to use for pain relief.

## 2017-05-20 NOTE — Telephone Encounter (Signed)
Spoke with him via phone. He is have her hearing watery stools. We did give her a large amount of antibiotics yesterday. He got concerned and went to the ED last night. They did CT abdomen pelvis, scrotal ultrasound, blood work, urinalysis. They told him he needed to take Cipro for full 10 days. He has not started that. Given the fact that he's having such diarrhea, I would recommend he does not take that. He was fully treated for STI yesterday and this should cover epididymitis as well. He will call me Monday with an update. He may need a note for school as he missed his Pletal signs final in May miss his [place] final.

## 2017-05-20 NOTE — Discharge Instructions (Signed)
Cipro as prescribed.   Tramadol as prescribed as needed for pain.  Follow up with your primary doctor if not improving in the next week.

## 2017-05-20 NOTE — ED Notes (Signed)
Pt stable, ambulatory, states understanding of discharge instructions 

## 2017-05-23 ENCOUNTER — Telehealth: Payer: Self-pay | Admitting: *Deleted

## 2017-05-23 NOTE — Telephone Encounter (Signed)
Patient left message on nurse line stating his symptoms improved as of Sat evening, feeling great now. Very thankful to PCP. Kinnie FeilL. Ducatte, RN, BSN

## 2017-05-26 ENCOUNTER — Telehealth: Payer: Self-pay | Admitting: *Deleted

## 2017-05-26 NOTE — Telephone Encounter (Signed)
LM on nurse line.    One of his patches fell off and he needed to open a new one.  Now he I sone short.  Fleeger, [name], CMA

## 2017-05-27 NOTE — Telephone Encounter (Signed)
Pt informed of the information below. Sunday SpillersSharon T Magdelena Montes, CMA

## 2017-05-27 NOTE — Telephone Encounter (Signed)
Dear Paul AstersWhite Team I doubt his insurance will pay for anymore than what he has already purchased so he just needs to wear the remaining patches he has and then try to get a new prescription.  If he goes without a patch for a couple of days he should be okay and it will not send him back very far. Paul LevySara Chrystie Montes

## 2017-06-01 ENCOUNTER — Other Ambulatory Visit: Payer: Self-pay | Admitting: Family Medicine

## 2017-06-01 MED ORDER — ESTRADIOL 0.05 MG/24HR TD PTWK: 0.0500 mg | MEDICATED_PATCH | TRANSDERMAL | 3 refills | Status: DC

## 2017-06-02 ENCOUNTER — Other Ambulatory Visit: Payer: Self-pay | Admitting: Family Medicine

## 2017-06-02 MED ORDER — IBUPROFEN 800 MG PO TABS: 800.0000 mg | ORAL_TABLET | Freq: Three times a day (TID) | ORAL | 1 refills | Status: DC | PRN

## 2017-06-15 ENCOUNTER — Encounter: Payer: Self-pay | Admitting: Family Medicine

## 2017-06-15 ENCOUNTER — Ambulatory Visit (INDEPENDENT_AMBULATORY_CARE_PROVIDER_SITE_OTHER): Payer: BLUE CROSS/BLUE SHIELD | Admitting: Family Medicine

## 2017-06-15 ENCOUNTER — Other Ambulatory Visit: Payer: Self-pay

## 2017-06-15 VITALS — BP 108/68 | HR 89 | Temp 98.1°F | Ht 67.0 in | Wt 130.8 lb

## 2017-06-15 DIAGNOSIS — Z789 Other specified health status: Secondary | ICD-10-CM

## 2017-06-15 DIAGNOSIS — F640 Transsexualism: Secondary | ICD-10-CM | POA: Diagnosis not present

## 2017-06-15 DIAGNOSIS — N451 Epididymitis: Secondary | ICD-10-CM

## 2017-06-15 DIAGNOSIS — F649 Gender identity disorder, unspecified: Secondary | ICD-10-CM

## 2017-06-15 NOTE — Patient Instructions (Signed)
I will send you a note about your lab work or you can access it via My chart. Great to see you again.  I will see you back in 2-3 months.  Please call me if anything comes up in the interim.  Have a happy new year!

## 2017-06-16 LAB — COMPREHENSIVE METABOLIC PANEL
ALK PHOS: 59 IU/L (ref 39–117)
ALT: 37 IU/L (ref 0–44)
AST: 23 IU/L (ref 0–40)
Albumin/Globulin Ratio: 2.1 (ref 1.2–2.2)
Albumin: 4.5 g/dL (ref 3.5–5.5)
BUN/Creatinine Ratio: 9 (ref 9–20)
BUN: 9 mg/dL (ref 6–20)
Bilirubin Total: 0.5 mg/dL (ref 0.0–1.2)
CALCIUM: 9.3 mg/dL (ref 8.7–10.2)
CO2: 24 mmol/L (ref 20–29)
CREATININE: 1.02 mg/dL (ref 0.76–1.27)
Chloride: 102 mmol/L (ref 96–106)
GFR calc Af Amer: 116 mL/min/{1.73_m2} (ref >59)
GFR calc non Af Amer: 100 mL/min/{1.73_m2} (ref >59)
GLUCOSE: 81 mg/dL (ref 65–99)
Globulin, Total: 2.1 g/dL (ref 1.5–4.5)
Potassium: 4.5 mmol/L (ref 3.5–5.2)
Sodium: 139 mmol/L (ref 134–144)
Total Protein: 6.6 g/dL (ref 6.0–8.5)

## 2017-06-16 LAB — CBC
HEMATOCRIT: 45.4 % (ref 37.5–51.0)
HEMOGLOBIN: 15.5 g/dL (ref 13.0–17.7)
MCH: 27.9 pg (ref 26.6–33.0)
MCHC: 34.1 g/dL (ref 31.5–35.7)
MCV: 82 fL (ref 79–97)
PLATELETS: 324 10*3/uL (ref 150–379)
RBC: 5.56 x10E6/uL (ref 4.14–5.80)
RDW: 14 % (ref 12.3–15.4)
WBC: 5.7 10*3/uL (ref 3.4–10.8)

## 2017-06-16 LAB — TESTOSTERONE, FREE, TOTAL, SHBG
SEX HORMONE BINDING: 46.2 nmol/L (ref 16.5–55.9)
TESTOSTERONE FREE: 2.9 pg/mL — AB (ref 9.3–26.5)
Testosterone: 62 ng/dL — ABNORMAL LOW (ref 264–916)

## 2017-06-16 LAB — ESTRADIOL: Estradiol: 49.1 pg/mL — ABNORMAL HIGH (ref 7.6–42.6)

## 2017-06-16 NOTE — Assessment & Plan Note (Addendum)
We will get labs today.  Continue current dose of estrogen and spironolactone.  We discussed continuing process in the transition.  He wants to continue to live as a male right now.  He continues to have facial hair.  He does not want to change his name at this point.  Most to become a little more feminine in appearance before he makes any of these transitions.  He is quite happy about the slight improvement in his relationship with his parents is his family is very important to him.  I will see him back in 2 months, sooner with problems. Greater than 50% of our 25 minute office visit was spent in counseling and education regarding these issues.

## 2017-06-16 NOTE — Progress Notes (Signed)
    CHIEF COMPLAINT / HPI: #1.  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  Feels like his skin is getting a little softer.  He has had a few episodes where he noticed some mild mood changes but nothing significant.  Overall he is quite pleased.  He has parents have reached a little bit better communication level.  He completed his examinations and is now graduated college.  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  He would also like to continue to progress through the transition before getting her into career management. 2.  Follow-up epididymitis: It resolved a couple days after he started the antibiotic therapy.  Oddly about a week later he had some left testicular pain that felt similar but not as severe and then it self resolved.  REVIEW OF SYSTEMS: See HPI.  No fever.  No unusual weight change.  PERTINENT  PMH / PSH: I have reviewed the patient's medications, allergies, past medical and surgical history, smoking status and updated in the EMR as appropriate.   OBJECTIVE: GENERAL: Well-developed male no acute distress CARDIOVASCULAR: Regular rate and rhythm no murmur LUNGS: Clear to auscultation ABDOMEN: Soft PSYCH: Alert and oriented x4.  Affect is interactive.  He seems fairly happy today.  Speech is normal fluency and content.  Recent remote memory intact.  Judgment normal.  ASSESSMENT / PLAN: #1.  Epididymitis seems to have resolved.  It is odd that he has had this twice and has occasional intermittent mild symptoms.  He had fairly complete workup in the emergency department including testicular Dopplers and ultrasound so I do not think there is any underlying anatomical abnormality.  He also does not seem to be related to his starting the estrogen therapy.  Symptoms have totally resolved right now so we will just follow. Please see problem  oriented charting for details

## 2017-06-17 ENCOUNTER — Encounter: Payer: Self-pay | Admitting: Family Medicine

## 2017-06-27 ENCOUNTER — Other Ambulatory Visit: Payer: Self-pay | Admitting: Family Medicine

## 2017-07-21 ENCOUNTER — Telehealth: Payer: Self-pay | Admitting: *Deleted

## 2017-07-21 DIAGNOSIS — F649 Gender identity disorder, unspecified: Secondary | ICD-10-CM

## 2017-07-21 NOTE — Telephone Encounter (Signed)
Pt lm on nurse line.  He would like a call from Dr. Jennette KettleNeal to discuss "some side effects and issues". Fleeger, [name], CMA

## 2017-07-25 MED ORDER — SPIRONOLACTONE 25 MG PO TABS: 12.5000 mg | ORAL_TABLET | Freq: Every day | ORAL | 3 refills | Status: DC

## 2017-07-25 NOTE — Telephone Encounter (Signed)
Having diarrhea--thinks it is spironolactone. Did a trial of stopping--it  Got btter--restarted, came back. Will try half dose daily and let me know ]

## 2017-08-10 ENCOUNTER — Encounter: Payer: Self-pay | Admitting: Family Medicine

## 2017-08-12 ENCOUNTER — Encounter: Payer: Self-pay | Admitting: Family Medicine

## 2017-08-12 ENCOUNTER — Other Ambulatory Visit: Payer: Self-pay | Admitting: Family Medicine

## 2017-08-12 DIAGNOSIS — F649 Gender identity disorder, unspecified: Secondary | ICD-10-CM

## 2017-08-12 DIAGNOSIS — F640 Transsexualism: Secondary | ICD-10-CM

## 2017-08-12 DIAGNOSIS — Z789 Other specified health status: Secondary | ICD-10-CM

## 2017-08-12 MED ORDER — FINASTERIDE 5 MG PO TABS: 5.0000 mg | ORAL_TABLET | Freq: Every day | ORAL | 3 refills | Status: DC

## 2017-08-12 NOTE — Telephone Encounter (Signed)
I sent in finasteride to replace spironolactone. I put in for your labs--it would be good to check estradiol, testosterone, cbc and CMP

## 2017-08-19 ENCOUNTER — Telehealth: Payer: Self-pay | Admitting: Family Medicine

## 2017-08-19 ENCOUNTER — Other Ambulatory Visit: Payer: Self-pay

## 2017-08-19 DIAGNOSIS — F640 Transsexualism: Secondary | ICD-10-CM

## 2017-08-19 DIAGNOSIS — F649 Gender identity disorder, unspecified: Secondary | ICD-10-CM

## 2017-08-19 DIAGNOSIS — Z789 Other specified health status: Secondary | ICD-10-CM

## 2017-08-19 NOTE — Telephone Encounter (Signed)
Pt is calling and would like to speak to Dr. Jennette KettleNeal about throwing up a lot. Only wants to talk to Dr. Jennette KettleNeal. Myriam JacobsonJw

## 2017-08-20 LAB — CMP14+EGFR
ALT: 26 IU/L (ref 0–44)
AST: 22 IU/L (ref 0–40)
Albumin/Globulin Ratio: 2.3 — ABNORMAL HIGH (ref 1.2–2.2)
Albumin: 4.5 g/dL (ref 3.5–5.5)
Alkaline Phosphatase: 55 IU/L (ref 39–117)
BUN/Creatinine Ratio: 10 (ref 9–20)
BUN: 10 mg/dL (ref 6–20)
Bilirubin Total: 0.6 mg/dL (ref 0.0–1.2)
CALCIUM: 8.9 mg/dL (ref 8.7–10.2)
CO2: 23 mmol/L (ref 20–29)
CREATININE: 0.97 mg/dL (ref 0.76–1.27)
Chloride: 102 mmol/L (ref 96–106)
GFR calc Af Amer: 123 mL/min/{1.73_m2} (ref >59)
GFR, EST NON AFRICAN AMERICAN: 107 mL/min/{1.73_m2} (ref >59)
Globulin, Total: 2 g/dL (ref 1.5–4.5)
Glucose: 87 mg/dL (ref 65–99)
Potassium: 4.2 mmol/L (ref 3.5–5.2)
Sodium: 140 mmol/L (ref 134–144)
Total Protein: 6.5 g/dL (ref 6.0–8.5)

## 2017-08-20 LAB — TESTOSTERONE, FREE, TOTAL, SHBG
SEX HORMONE BINDING: 60.4 nmol/L — AB (ref 16.5–55.9)
TESTOSTERONE FREE: 2.6 pg/mL — AB (ref 9.3–26.5)
Testosterone: 128 ng/dL — ABNORMAL LOW (ref 264–916)

## 2017-08-20 LAB — ESTRADIOL: Estradiol: 125.4 pg/mL — ABNORMAL HIGH (ref 7.6–42.6)

## 2017-08-20 LAB — CBC
Hematocrit: 45.2 % (ref 37.5–51.0)
Hemoglobin: 15.3 g/dL (ref 13.0–17.7)
MCH: 28.3 pg (ref 26.6–33.0)
MCHC: 33.8 g/dL (ref 31.5–35.7)
MCV: 84 fL (ref 79–97)
PLATELETS: 326 10*3/uL (ref 150–379)
RBC: 5.4 x10E6/uL (ref 4.14–5.80)
RDW: 13.9 % (ref 12.3–15.4)
WBC: 6.4 10*3/uL (ref 3.4–10.8)

## 2017-08-24 ENCOUNTER — Ambulatory Visit (INDEPENDENT_AMBULATORY_CARE_PROVIDER_SITE_OTHER): Payer: Self-pay | Admitting: Family Medicine

## 2017-08-24 ENCOUNTER — Other Ambulatory Visit: Payer: Self-pay

## 2017-08-24 VITALS — BP 110/60 | HR 101 | Temp 98.3°F | Ht 67.0 in | Wt 126.0 lb

## 2017-08-24 DIAGNOSIS — R112 Nausea with vomiting, unspecified: Secondary | ICD-10-CM

## 2017-08-24 MED ORDER — ONDANSETRON 8 MG PO TBDP: 8.0000 mg | ORAL_TABLET | Freq: Three times a day (TID) | ORAL | 0 refills | Status: DC | PRN

## 2017-08-24 NOTE — Telephone Encounter (Signed)
Pt calling back about wanting to speak to Dr. Jennette KettleNeal. Pt is having stomach issues and is throwing up a lot. Pt also has questions about the treatment he is going through. Please advise

## 2017-08-24 NOTE — Progress Notes (Signed)
   Subjective:    Patient ID: Paul Montes , adult   DOB: 12/28/1990 , 27 y.o..   MRN: [id number]  HPI  Paul Montes is here for  Chief Complaint  Patient presents with  . nausea and vomiting    x 5 days    1. Nausea and vomiting: Patient endorses nausea, vomiting, abdominal bloating for the last 5 days.  He notes that he has not vomited at all for the last 24 hours however last week he did vomit a couple times.  He started to take Zofran which was a leftover prescription that he had at his house, this helped him a lot.  He took 1 dose today.  He notes that he has not had any vomiting today but has felt to "gaggy" in the back of his throat.  He has been able to take good p.o., drinking fluids normally.  He notes that he is eating but just not as much as he usually does.  Denies any diarrhea, constipation, fever, recent medication changes.  Of note he never picked up his finasteride from the pharmacy.  Review of Systems: Per HPI.   Past Medical History: Patient Active Problem List   Diagnosis Date Noted  . 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 Gender dysphoria 04/14/2017    Medications: reviewed   Social Hx:  reports that  has never smoked. she has never used smokeless tobacco.   Objective:   BP 110/60   Pulse (!) 101   Temp 98.3 F (36.8 C) (Oral)   Ht 5\' 7"  (1.702 m)   Wt 126 lb (57.2 kg)   SpO2 97%   BMI 19.73 kg/m  Physical Exam  Gen: NAD, alert, cooperative with exam, well-appearing, pleasant HEENT: NCAT, PERRL, clear conjunctiva, oropharynx clear, supple neck Cardiac: Regular rate and rhythm Respiratory: Clear to auscultation bilaterally, no wheezes, non-labored breathing Gastrointestinal: soft, non tender, non distended, bowel sounds present Psych: good insight, normal mood and affect  Assessment & Plan:   1. Nausea and vomiting: Symptoms nearly resolved after starting Zofran at home.  Still feels "gaggy" but tolerating good p.o.  No fevers  or abdominal pain on exam.  Abdominal exam is benign.  Suspect that he may have a resolving viral gastroenteritis versus nausea and vomiting from increasing estrogen as he transitions from male to male.  Also discussed patient's most recent lab results he is pleased that his testosterone continues to be low and his estrogen has, increased. -Discussed "brat" diet as patient and eating more bland foods as his symptoms improve -Zofran 8 mg every 8 hours as needed -Return precautions discussed -She has not picked up his finasteride from the pharmacy yet, discussed starting this once his symptoms improve -Follow-up with Dr. Jennette KettleNeal in 2 weeks to ensure improvement   Anders Simmondshristina Jordanny Waddington, MD [facility], PGY-3

## 2017-08-24 NOTE — Patient Instructions (Addendum)
Thank you for coming in today, it was so nice to see you! Today we talked about:    Nausea and vomiting: This could be from a viral gastroenteritis. This should get better over the next week if that is the case. There is a chance the change in estrogen could be causing your symptoms as well. You can take the Zofran as needed.   Your hormones look good  Please follow up ASAP with Dr. Jennette KettleNeal. You can schedule this appointment at the front desk before you leave or call the clinic.  If you have any questions or concerns, please do not hesitate to call the office at [phone]. You can also message me directly via MyChart.   Sincerely,  Anders Simmondshristina Jakiah Goree, MD

## 2017-08-29 NOTE — Telephone Encounter (Signed)
Answered questions at ov w Dr Dyke BrackettG Kazuma Elena

## 2017-09-05 ENCOUNTER — Ambulatory Visit: Payer: Self-pay | Admitting: Nurse Practitioner

## 2017-09-05 VITALS — BP 122/84 | HR 108 | Temp 98.4°F | Wt 129.0 lb

## 2017-09-05 DIAGNOSIS — J069 Acute upper respiratory infection, unspecified: Secondary | ICD-10-CM

## 2017-09-05 MED ORDER — PSEUDOEPH-BROMPHEN-DM 30-2-10 MG/5ML PO SYRP: 5.0000 mL | ORAL_SOLUTION | Freq: Four times a day (QID) | ORAL | 0 refills | Status: AC | PRN

## 2017-09-05 MED ORDER — AZITHROMYCIN 250 MG PO TABS: ORAL_TABLET | ORAL | 0 refills | Status: DC

## 2017-09-05 MED ORDER — AZITHROMYCIN 250 MG PO TABS: ORAL_TABLET | ORAL | 0 refills | Status: AC

## 2017-09-05 MED FILL — AZITHROMYCIN 250 MG TABLET: 250 | 5 days supply | Qty: 6 | Fill #0

## 2017-09-05 MED FILL — BROMPHENIR-PSEUDOEPHED-DM S: 30-2-10 | 8 days supply | Qty: 150 | Fill #0

## 2017-09-05 NOTE — Progress Notes (Signed)
Subjective:  Tomi BambergerZachary K Varon is a 27 y.o. adult who presents for evaluation of URI like symptoms.  Symptoms include achiness, cough described as productive of dark green sputum, fever: fevers up to 101 degrees, nausea with vomiting, post nasal drip and sore throat.  Onset of symptoms was 6 days ago, and has been gradually worsening since that time.  Treatment to date:  acetaminophen, cough suppressants, decongestants and ibuprofen.  High risk factors for influenza complications:  none.  The following portions of the patient's history were reviewed and updated as appropriate:  allergies, current medications and past medical history.  Constitutional: positive for anorexia, chills, fatigue, fevers and malaise, negative for night sweats and sweats Eyes: negative Ears, nose, mouth, throat, and face: positive for nasal congestion and sore throat, negative for ear drainage, earaches and hoarseness Respiratory: positive for cough and sputum, negative for asthma, chronic bronchitis, dyspnea on exertion, pneumonia, stridor and wheezing Cardiovascular: negative Gastrointestinal: positive for nausea, vomiting and decreased appetite, negative for abdominal pain and constipation Neurological: positive for headaches, negative for coordination problems, dizziness, paresthesia, seizures, tremors and weakness Allergic/Immunologic: negative Objective:  BP 122/84   Pulse (!) 108   Temp 98.4 F (36.9 C)   Wt 129 lb (58.5 kg)   SpO2 98%   BMI 20.20 kg/m      Physical Exam  Constitutional: She is oriented to person, place, and time. She appears well-developed and well-nourished. No distress.  HENT:  Head: Normocephalic and atraumatic.  Right Ear: External ear normal.  Left Ear: External ear normal.  Nose: Nose normal.  Mouth/Throat: Oropharynx is clear and moist. No oropharyngeal exudate.  Eyes: Conjunctivae and EOM are normal. Pupils are equal, round, and reactive to light.  Neck: Normal range of  motion. Neck supple. No tracheal deviation present. No thyromegaly present.  Cardiovascular: Normal rate, regular rhythm and normal heart sounds.  Pulmonary/Chest: Effort normal and breath sounds normal. No respiratory distress.  Abdominal: Soft. Bowel sounds are normal.  Neurological: She is alert and oriented to person, place, and time.  Skin: Skin is warm and dry. Capillary refill takes less than 2 seconds. No rash noted.  Psychiatric: She has a normal mood and affect. Her behavior is normal. Judgment and thought content normal.                                                                 Assessment:  Acute Upper Respiratory Infection    Plan:  Discussed diagnosis and treatment of URI. Educational material distributed and questions answered. Suggested symptomatic OTC remedies. Supportive care with appropriate antipyretics and fluids. Zithromax per orders. Follow up as needed.  Patient also prescribed Bromfed for cough.  Patient to sleep elevated on 2 pillows, use humidifier, increase fluids.  OTC remedies such as cough drops, honey, etc for cough.  Patient verbalizes understanding and all questions answered at time of discharge.

## 2017-09-05 NOTE — Addendum Note (Signed)
Addended by: Jackquline BerlinLEATH, Jakara Blatter J on: 09/05/2017 10:56 AM   Modules accepted: Orders

## 2017-09-05 NOTE — Patient Instructions (Addendum)
Upper Respiratory Infection, Adult Most upper respiratory infections (URIs) are a viral infection of the air passages leading to the lungs. A URI affects the nose, throat, and upper air passages. The most common type of URI is nasopharyngitis and is typically referred to as "the common cold." URIs run their course and usually go away on their own. Most of the time, a URI does not require medical attention, but sometimes a bacterial infection in the upper airways can follow a viral infection. This is called a secondary infection. Sinus and middle ear infections are common types of secondary upper respiratory infections. Bacterial pneumonia can also complicate a URI. A URI can worsen asthma and chronic obstructive pulmonary disease (COPD). Sometimes, these complications can require emergency medical care and may be life threatening. What are the causes? Almost all URIs are caused by viruses. A virus is a type of germ and can spread from one person to another. What increases the risk? You may be at risk for a URI if:  You smoke.  You have chronic heart or lung disease.  You have a weakened defense (immune) system.  You are very young or very old.  You have nasal allergies or asthma.  You work in crowded or poorly ventilated areas.  You work in health care facilities or schools.  What are the signs or symptoms? Symptoms typically develop 2-3 days after you come in contact with a cold virus. Most viral URIs last 7-10 days. However, viral URIs from the influenza virus (flu virus) can last 14-18 days and are typically more severe. Symptoms may include:  Runny or stuffy (congested) nose.  Sneezing.  Cough.  Sore throat.  Headache.  Fatigue.  Fever.  Loss of appetite.  Pain in your forehead, behind your eyes, and over your cheekbones (sinus pain).  Muscle aches.  How is this diagnosed? Your health care provider may diagnose a URI by:  Physical exam.  Tests to check that your  symptoms are not due to another condition such as: ? Strep throat. ? Sinusitis. ? Pneumonia. ? Asthma.  How is this treated? A URI goes away on its own with time. It cannot be cured with medicines, but medicines may be prescribed or recommended to relieve symptoms. Medicines may help:  Reduce your fever.  Reduce your cough.  Relieve nasal congestion.  Follow these instructions at home:  Take medicines only as directed by your health care provider.  Gargle warm saltwater or take cough drops to comfort your throat as directed by your health care provider.  Use a warm mist humidifier or inhale steam from a shower to increase air moisture. This may make it easier to breathe.  Drink enough fluid to keep your urine clear or pale yellow.  Eat soups and other clear broths and maintain good nutrition.  Rest as needed.  Return to work when your temperature has returned to normal or as your health care provider advises. You may need to stay home longer to avoid infecting others. You can also use a face mask and careful hand washing to prevent spread of the virus.  Increase the usage of your inhaler if you have asthma.  Do not use any tobacco products, including cigarettes, chewing tobacco, or electronic cigarettes. If you need help quitting, ask your health care provider. How is this prevented? The best way to protect yourself from getting a cold is to practice good hygiene.  Avoid oral or hand contact with people with cold symptoms.  Wash your   hands often if contact occurs.  There is no clear evidence that vitamin C, vitamin E, echinacea, or exercise reduces the chance of developing a cold. However, it is always recommended to get plenty of rest, exercise, and practice good nutrition. Contact a health care provider if:  You are getting worse rather than better.  Your symptoms are not controlled by medicine.  You have chills.  You have worsening shortness of breath.  You have  brown or red mucus.  You have yellow or brown nasal discharge.  You have pain in your face, especially when you bend forward.  You have a fever.  You have swollen neck glands.  You have pain while swallowing.  You have white areas in the back of your throat. Get help right away if:  You have severe or persistent: ? Headache. ? Ear pain. ? Sinus pain. ? Chest pain.  You have chronic lung disease and any of the following: ? Wheezing. ? Prolonged cough. ? Coughing up blood. ? A change in your usual mucus.  You have a stiff neck.  You have changes in your: ? Vision. ? Hearing. ? Thinking. ? Mood. This information is not intended to replace advice given to you by your health care provider. Make sure you discuss any questions you have with your health care provider. Document Released: 12/01/2000 Document Revised: 02/08/2016 Document Reviewed: 09/12/2013 Elsevier Interactive Patient Education  2018 Elsevier Inc.  

## 2017-09-07 ENCOUNTER — Other Ambulatory Visit: Payer: Self-pay

## 2017-09-07 ENCOUNTER — Encounter: Payer: Self-pay | Admitting: Family Medicine

## 2017-09-07 ENCOUNTER — Ambulatory Visit (INDEPENDENT_AMBULATORY_CARE_PROVIDER_SITE_OTHER): Payer: Self-pay | Admitting: Family Medicine

## 2017-09-07 ENCOUNTER — Ambulatory Visit: Payer: Self-pay | Admitting: Family Medicine

## 2017-09-07 DIAGNOSIS — Z789 Other specified health status: Secondary | ICD-10-CM

## 2017-09-07 DIAGNOSIS — F640 Transsexualism: Secondary | ICD-10-CM

## 2017-09-07 NOTE — Progress Notes (Signed)
    CHIEF COMPLAINT / HPI: Follow-up of recent upper respiratory infection with some nausea and vomiting.  This is improving. 2.  Transgender: He did not yet he has been filled.  He has a lot of questions about that.  REVIEW OF SYSTEMS: Beginning breast bud development.  Diarrhea is resolved.  PERTINENT  PMH / PSH: I have reviewed the patient's medications, allergies, past medical and surgical history, smoking status and updated in the EMR as appropriate.   OBJECTIVE: Vital signs reviewed. GENERAL: Well-developed, well-nourished, no acute distress. CARDIOVASCULAR: Regular rate and rhythm no murmur gallop or rub LUNGS: Clear to auscultation bilaterally, no rales or wheeze. ABDOMEN: Soft positive bowel sounds NEURO: No gross focal neurological deficits. MSK: Movement of extremity x 4.    ASSESSMENT / PLAN: Please see problem oriented charting for details

## 2017-09-07 NOTE — Assessment & Plan Note (Signed)
We discussed finasteride at length.  Like him to try that at least in half dose and come get some lab work also like me to check his estrone level.

## 2017-09-08 ENCOUNTER — Telehealth: Payer: Self-pay

## 2017-09-08 NOTE — Telephone Encounter (Signed)
Called and followed up with pt to see how he was feeling and pt states he is feeling better.

## 2017-11-24 ENCOUNTER — Encounter: Payer: Self-pay | Admitting: Family Medicine

## 2017-11-30 ENCOUNTER — Other Ambulatory Visit: Payer: Self-pay | Admitting: Family Medicine

## 2017-11-30 DIAGNOSIS — Z789 Other specified health status: Secondary | ICD-10-CM

## 2017-11-30 DIAGNOSIS — F640 Transsexualism: Secondary | ICD-10-CM

## 2017-12-06 ENCOUNTER — Other Ambulatory Visit: Payer: Self-pay

## 2017-12-06 DIAGNOSIS — Z789 Other specified health status: Secondary | ICD-10-CM

## 2017-12-06 DIAGNOSIS — F640 Transsexualism: Secondary | ICD-10-CM

## 2017-12-07 LAB — ESTRADIOL: Estradiol: 56.3 pg/mL — ABNORMAL HIGH (ref 7.6–42.6)

## 2017-12-07 LAB — BASIC METABOLIC PANEL
BUN / CREAT RATIO: 8 — AB (ref 9–20)
BUN: 7 mg/dL (ref 6–20)
CO2: 24 mmol/L (ref 20–29)
Calcium: 9.1 mg/dL (ref 8.7–10.2)
Chloride: 103 mmol/L (ref 96–106)
Creatinine, Ser: 0.89 mg/dL (ref 0.76–1.27)
GFR calc Af Amer: 135 mL/min/{1.73_m2} (ref >59)
GFR calc non Af Amer: 117 mL/min/{1.73_m2} (ref >59)
Glucose: 92 mg/dL (ref 65–99)
POTASSIUM: 4.5 mmol/L (ref 3.5–5.2)
SODIUM: 141 mmol/L (ref 134–144)

## 2017-12-07 LAB — TESTOSTERONE, FREE, TOTAL, SHBG
Sex Hormone Binding: 64.8 nmol/L — ABNORMAL HIGH (ref 16.5–55.9)
TESTOSTERONE FREE: 2.9 pg/mL — AB (ref 9.3–26.5)
TESTOSTERONE: 142 ng/dL — AB (ref 264–916)

## 2017-12-07 LAB — CBC
Hematocrit: 46.4 % (ref 37.5–51.0)
Hemoglobin: 15.2 g/dL (ref 13.0–17.7)
MCH: 27.9 pg (ref 26.6–33.0)
MCHC: 32.8 g/dL (ref 31.5–35.7)
MCV: 85 fL (ref 79–97)
PLATELETS: 370 10*3/uL (ref 150–450)
RBC: 5.45 x10E6/uL (ref 4.14–5.80)
RDW: 14.2 % (ref 12.3–15.4)
WBC: 5.9 10*3/uL (ref 3.4–10.8)

## 2017-12-12 ENCOUNTER — Other Ambulatory Visit: Payer: Self-pay | Admitting: Family Medicine

## 2017-12-14 ENCOUNTER — Encounter: Payer: Self-pay | Admitting: Family Medicine

## 2017-12-18 ENCOUNTER — Other Ambulatory Visit: Payer: Self-pay | Admitting: Family Medicine

## 2017-12-18 DIAGNOSIS — F649 Gender identity disorder, unspecified: Secondary | ICD-10-CM

## 2017-12-19 ENCOUNTER — Telehealth: Payer: Self-pay | Admitting: Family Medicine

## 2017-12-19 DIAGNOSIS — F649 Gender identity disorder, unspecified: Secondary | ICD-10-CM

## 2017-12-19 MED ORDER — ESTRADIOL VALERATE 20 MG/ML IM OIL: TOPICAL_OIL | INTRAMUSCULAR | 0 refills | Status: DC

## 2017-12-19 MED ORDER — SPIRONOLACTONE 25 MG PO TABS: 25.0000 mg | ORAL_TABLET | Freq: Every day | ORAL | 3 refills | Status: DC

## 2017-12-19 NOTE — Telephone Encounter (Signed)
From MY CHART:Well the results are in and very disappointing. I definitely want to start injection and I want to very quickly move my 56 pmol/L estradiol levels to 500 pmol/L. So I not only need a prescription for the injections, I want a much higher dose since the amount I was on never got passed 125 pmol\L even on the full Spiro amount. I've read online that injections at that range should remove the need for Cleda DaubSpiro and keep my T levels low enough on its own. So the dosing I'm after according to trans care websites would be somewhere in between the 10mg  q 2/52 to 10mg  q 1/52 range with 10mg  q 1/52 being the max dose. I will let you make that call but I want to be nearing that 500 pmol/L level goal within the next 3 months. Let me know what I need to do. Thank you for your time as always.  I called Zach. Here is what I recommend:  We restart the spironolactone Will switch to injectable estradiol valerate See me in 2weeks He also has no insuance so will have him set up with financial counseling here  Denny LevySara Neal

## 2018-01-24 ENCOUNTER — Encounter: Payer: Self-pay | Admitting: Family Medicine

## 2018-03-20 ENCOUNTER — Other Ambulatory Visit: Payer: Self-pay

## 2018-03-20 ENCOUNTER — Other Ambulatory Visit: Payer: Self-pay | Admitting: Family Medicine

## 2018-03-20 DIAGNOSIS — F649 Gender identity disorder, unspecified: Secondary | ICD-10-CM

## 2018-03-20 DIAGNOSIS — F640 Transsexualism: Secondary | ICD-10-CM

## 2018-03-20 DIAGNOSIS — Z789 Other specified health status: Secondary | ICD-10-CM

## 2018-03-21 LAB — ESTRADIOL: Estradiol: 1196 pg/mL — ABNORMAL HIGH (ref 7.6–42.6)

## 2018-03-22 LAB — TESTOSTERONE, FREE: TESTOSTERONE FREE: 0.5 pg/mL — AB (ref 9.3–26.5)

## 2018-03-23 ENCOUNTER — Encounter: Payer: Self-pay | Admitting: Family Medicine

## 2018-04-03 ENCOUNTER — Encounter: Payer: Self-pay | Admitting: Family Medicine

## 2018-04-04 ENCOUNTER — Other Ambulatory Visit: Payer: Self-pay | Admitting: Family Medicine

## 2018-04-04 DIAGNOSIS — F640 Transsexualism: Secondary | ICD-10-CM

## 2018-04-04 DIAGNOSIS — Z789 Other specified health status: Secondary | ICD-10-CM

## 2018-04-14 ENCOUNTER — Other Ambulatory Visit: Payer: Self-pay

## 2018-04-14 DIAGNOSIS — Z789 Other specified health status: Secondary | ICD-10-CM

## 2018-04-14 DIAGNOSIS — F640 Transsexualism: Secondary | ICD-10-CM

## 2018-04-15 LAB — TESTOSTERONE: Testosterone: 9 ng/dL — ABNORMAL LOW (ref 264–916)

## 2018-04-15 LAB — ESTRADIOL: ESTRADIOL: 74 pg/mL — AB (ref 7.6–42.6)

## 2018-04-17 ENCOUNTER — Encounter: Payer: Self-pay | Admitting: Family Medicine

## 2018-04-17 ENCOUNTER — Other Ambulatory Visit: Payer: Self-pay | Admitting: Family Medicine

## 2018-04-18 MED ORDER — ESTRADIOL VALERATE 20 MG/ML IM OIL: TOPICAL_OIL | INTRAMUSCULAR | 0 refills | Status: DC

## 2018-06-16 ENCOUNTER — Telehealth: Payer: Self-pay

## 2018-06-16 ENCOUNTER — Encounter: Payer: Self-pay | Admitting: Family Medicine

## 2018-06-16 NOTE — Telephone Encounter (Signed)
Called and advised to not guess and just take next dose as scheduled.

## 2018-06-16 NOTE — Telephone Encounter (Signed)
Pt called nurse line stating he does IM 0.585ml estradiol injections every 12 days. Pt stated last night during his injection the needle cap broke off and he lost "either .1ml or .2ml or more." Pt is wondering if he should "guess" at the  dose lost and reinjeect himself today. Or if he should just wait until his next dose. Please advise.

## 2018-06-27 ENCOUNTER — Other Ambulatory Visit: Payer: Self-pay | Admitting: Family Medicine

## 2018-06-27 DIAGNOSIS — Z789 Other specified health status: Secondary | ICD-10-CM

## 2018-06-27 DIAGNOSIS — F640 Transsexualism: Secondary | ICD-10-CM

## 2018-06-27 DIAGNOSIS — F649 Gender identity disorder, unspecified: Secondary | ICD-10-CM

## 2018-06-30 ENCOUNTER — Other Ambulatory Visit: Payer: Self-pay | Admitting: Family Medicine

## 2018-06-30 DIAGNOSIS — F640 Transsexualism: Secondary | ICD-10-CM

## 2018-06-30 DIAGNOSIS — Z789 Other specified health status: Secondary | ICD-10-CM

## 2018-07-05 ENCOUNTER — Other Ambulatory Visit: Payer: Self-pay

## 2018-07-05 DIAGNOSIS — F649 Gender identity disorder, unspecified: Secondary | ICD-10-CM

## 2018-07-05 DIAGNOSIS — F640 Transsexualism: Secondary | ICD-10-CM

## 2018-07-05 DIAGNOSIS — Z789 Other specified health status: Secondary | ICD-10-CM

## 2018-07-06 LAB — CMP14+EGFR
A/G RATIO: 2 (ref 1.2–2.2)
ALT: 27 IU/L (ref 0–44)
AST: 23 IU/L (ref 0–40)
Albumin: 4.3 g/dL (ref 3.5–5.5)
Alkaline Phosphatase: 63 IU/L (ref 39–117)
BUN/Creatinine Ratio: 11 (ref 9–20)
BUN: 8 mg/dL (ref 6–20)
Bilirubin Total: 0.4 mg/dL (ref 0.0–1.2)
CALCIUM: 8.9 mg/dL (ref 8.7–10.2)
CHLORIDE: 102 mmol/L (ref 96–106)
CO2: 23 mmol/L (ref 20–29)
Creatinine, Ser: 0.72 mg/dL — ABNORMAL LOW (ref 0.76–1.27)
GFR calc Af Amer: 147 mL/min/{1.73_m2} (ref >59)
GFR, EST NON AFRICAN AMERICAN: 127 mL/min/{1.73_m2} (ref >59)
GLUCOSE: 90 mg/dL (ref 65–99)
Globulin, Total: 2.1 g/dL (ref 1.5–4.5)
POTASSIUM: 4.1 mmol/L (ref 3.5–5.2)
Sodium: 139 mmol/L (ref 134–144)
TOTAL PROTEIN: 6.4 g/dL (ref 6.0–8.5)

## 2018-07-06 LAB — TESTOSTERONE: TESTOSTERONE: 13 ng/dL — AB (ref 264–916)

## 2018-07-06 LAB — ESTRADIOL: ESTRADIOL: 233.6 pg/mL — AB (ref 7.6–42.6)

## 2018-07-11 ENCOUNTER — Encounter: Payer: Self-pay | Admitting: Family Medicine

## 2018-07-18 ENCOUNTER — Other Ambulatory Visit: Payer: Self-pay | Admitting: Family Medicine

## 2018-07-18 MED ORDER — ESTRADIOL VALERATE 20 MG/ML IM OIL: TOPICAL_OIL | INTRAMUSCULAR | 0 refills | Status: DC

## 2018-08-23 ENCOUNTER — Telehealth: Payer: Self-pay | Admitting: Family Medicine

## 2018-08-23 NOTE — Telephone Encounter (Signed)
Dear [facility] Team Can you call [facility] and see if there is a specific question? If not, perhaps I can address his issues at the appointment? THANKS! Denny Levy

## 2018-08-23 NOTE — Telephone Encounter (Signed)
Pt called to schedule and appointment with Dr. Jennette Kettle to discuss some things they are having issues with. Pt is scheduled for next available 10/04/18 but would like to speak with Dr. Jennette Kettle before this appointment.   Please call patient back.

## 2018-08-23 NOTE — Telephone Encounter (Signed)
Contacted pt and he said that he was wanting to see about a patch that he has had for about a year, says it might be eczema.  Also he said that his foot feels like there is a piece of tape on it when there is nothing.  He said that he would wait for his visit with you and that if he felt like it needed to be addressed before then he would come in and see someone for those things.Paul Montes, Jaslynne Dahan D, [place]

## 2018-10-04 ENCOUNTER — Ambulatory Visit: Payer: Self-pay | Admitting: Family Medicine

## 2018-10-30 ENCOUNTER — Other Ambulatory Visit: Payer: Self-pay

## 2018-10-30 ENCOUNTER — Telehealth (INDEPENDENT_AMBULATORY_CARE_PROVIDER_SITE_OTHER): Payer: Self-pay | Admitting: Family Medicine

## 2018-10-30 ENCOUNTER — Encounter: Payer: Self-pay | Admitting: Family Medicine

## 2018-10-30 DIAGNOSIS — J029 Acute pharyngitis, unspecified: Secondary | ICD-10-CM

## 2018-10-30 DIAGNOSIS — L209 Atopic dermatitis, unspecified: Secondary | ICD-10-CM | POA: Insufficient documentation

## 2018-10-30 MED ORDER — TRIAMCINOLONE ACETONIDE 0.1 % EX CREA: 1.0000 "application " | TOPICAL_CREAM | Freq: Two times a day (BID) | CUTANEOUS | 0 refills | Status: DC

## 2018-10-30 NOTE — Assessment & Plan Note (Signed)
Viral vs allergic in nature.  Patient denies history of allergies, but has had rhinorrhea.  No fever or concern for bacterial sinusitis, pneumonia, COVID-19.  Patient very well appearing on video visit and in NAD.  Advised to use OTC allergy medication as is likely caused by post-nasal drip.  Advised to call back if no improvement in symptoms in 4 days, or worsening of symptoms.  Return precautions discussed.

## 2018-10-30 NOTE — Progress Notes (Signed)
Subjective: Chief Complaint  Patient presents with  . Sore Throat  . Rash   Visit Conducted via Video through MyChart Patient consented to Video Visit  HPI: Paul Montes is a 28 y.o. presenting to clinic today to discuss the following:  1 Sore Throat Patient reports his sore throat for the last 4 to 5 days which he describes as a dull pain or irritation.  He is denies any associated cough, fever, shortness of breath, chest pain.  States that he has been eating and drinking a normal amount.  Also notes that he has a what he believes to be a swollen lymph node on the left side of his neck and anterior cervical chain.  Patient notes that it is nontender and "not very big and has not changed at all."  He reports that his throat is not red and that he does not have any white exudates on his tonsils.  He had a headache only yesterday, but otherwise no headaches associated with sore throat.  He denies any itchy or watery eyes.  States that the pain is worse in the morning and at night while trying to sleep.  Notes that he also had a runny nose for a few days, but is no longer having this.  Denies congestion.  Patient reports that he has been self isolating since mid March.  2 Rash Patient notes that he has a rash on his right lower extremity which is been there about a year and a half.  He states that it is itchy, dry, scaly.  States that when he scratches it it is somewhat painful.  States that he has used many over-the-counter moisture creams as well as an antifungal cream for about a month without improvement.  States that he is also had various similar patches in other areas of his body.     ROS noted in HPI.   Past Medical, Surgical, Social, and Family History Reviewed & Updated per EMR.   Pertinent Historical Findings include:   Social History   Tobacco Use  Smoking Status Never Smoker  Smokeless Tobacco Never Used      Objective: There were no vitals taken for this  visit. Vitals and nursing notes reviewed  Physical Exam:  General: 28 y.o. transgender male / male-to-male in NAD, seen on video visit HEENT: NCAT, neck ROM appears full, no visible neck deformity noted on video Lungs: speaking in complete sentences without respiratory distress, walking through his house during encounter Skin: erythematous dry scaly patch on lower RLE seen on video, no ulcerations noted   No results found for this or any previous visit (from the past 72 hour(s)).  Assessment/Plan:  Atopic dermatitis Suspect atopic dermatitis vs psoriasis given appearance and history.  Will prescribe Kenalog cream to use BID x 14 days.  Patient given return precautions and advised to make appointment if no improvement in the next two weeks. - kenalog cream  Sore throat Viral vs allergic in nature.  Patient denies history of allergies, but has had rhinorrhea.  No fever or concern for bacterial sinusitis, pneumonia, COVID-19.  Patient very well appearing on video visit and in NAD.  Advised to use OTC allergy medication as is likely caused by post-nasal drip.  Advised to call back if no improvement in symptoms in 4 days, or worsening of symptoms.  Return precautions discussed.     PATIENT EDUCATION PROVIDED: See AVS    Diagnosis and plan along with any newly prescribed medication(s)  were discussed in detail with this patient today. The patient verbalized understanding and agreed with the plan. Patient advised if symptoms worsen return to clinic or ER.    No orders of the defined types were placed in this encounter.   Meds ordered this encounter  Medications  . triamcinolone cream (KENALOG) 0.1 %    Sig: Apply 1 application topically 2 (two) times daily.    Dispense:  30 g    Refill:  0     Luis Abed, DO 10/30/2018, 10:36 AM PGY-1 [facility]

## 2018-10-30 NOTE — Assessment & Plan Note (Signed)
Suspect atopic dermatitis vs psoriasis given appearance and history.  Will prescribe Kenalog cream to use BID x 14 days.  Patient given return precautions and advised to make appointment if no improvement in the next two weeks. - kenalog cream

## 2018-11-06 ENCOUNTER — Other Ambulatory Visit: Payer: Self-pay | Admitting: Family Medicine

## 2018-11-07 MED ORDER — ESTRADIOL VALERATE 20 MG/ML IM OIL: TOPICAL_OIL | INTRAMUSCULAR | 0 refills | Status: DC

## 2019-01-29 IMAGING — US US SCROTUM W/ DOPPLER COMPLETE
1 series · 13 of 25 positions shown · non-contrast
Comparison: None.

CLINICAL DATA: Initial evaluation for acute right greater left
scrotal pain for 4 days.

EXAM:
SCROTAL ULTRASOUND
DOPPLER ULTRASOUND OF THE TESTICLES
TECHNIQUE: Complete ultrasound examination of the testicles, epididymis, and
other scrotal structures was performed. Color and spectral Doppler
ultrasound were also utilized to evaluate blood flow to the
testicles.

[Series 1: us scrotum w/ doppler complete · 0.06mm/px · 13 of 46 slices shown]
[im 1/46]
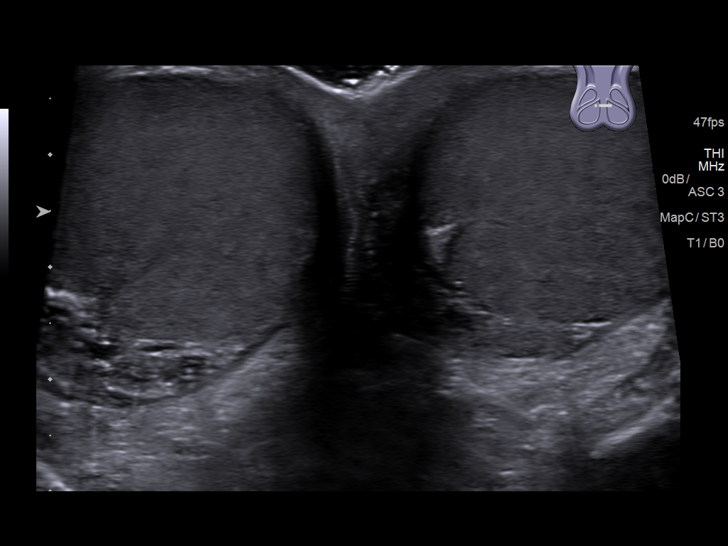
[im 4/46]
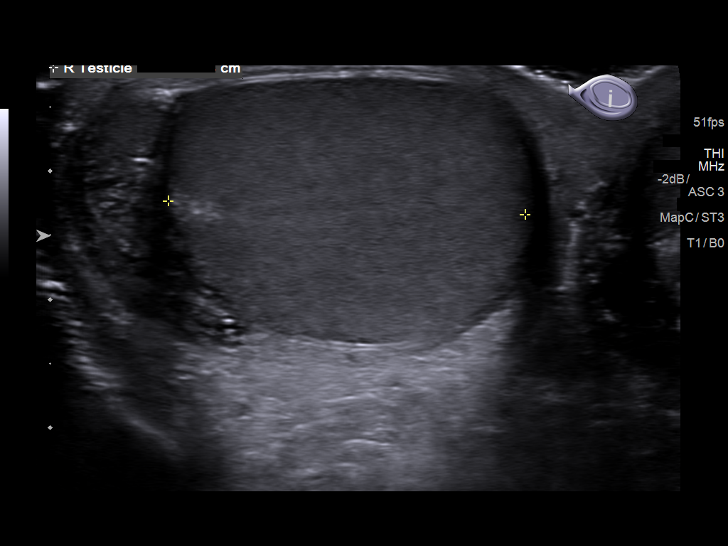
[im 8/46]
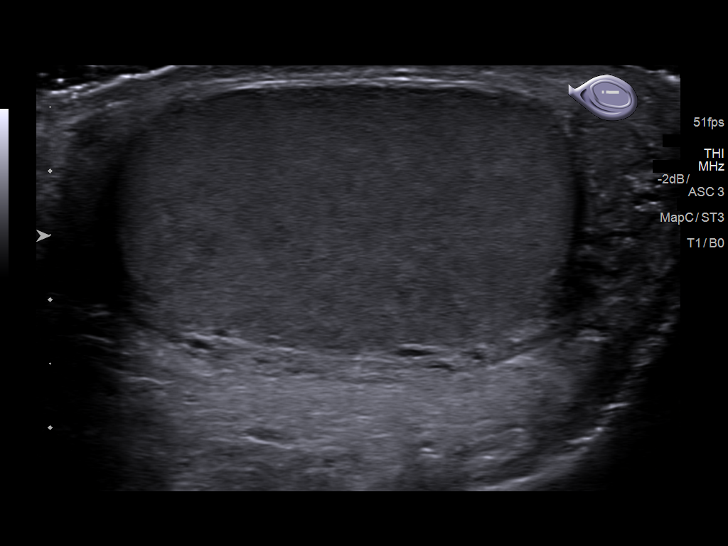
[im 12/46]
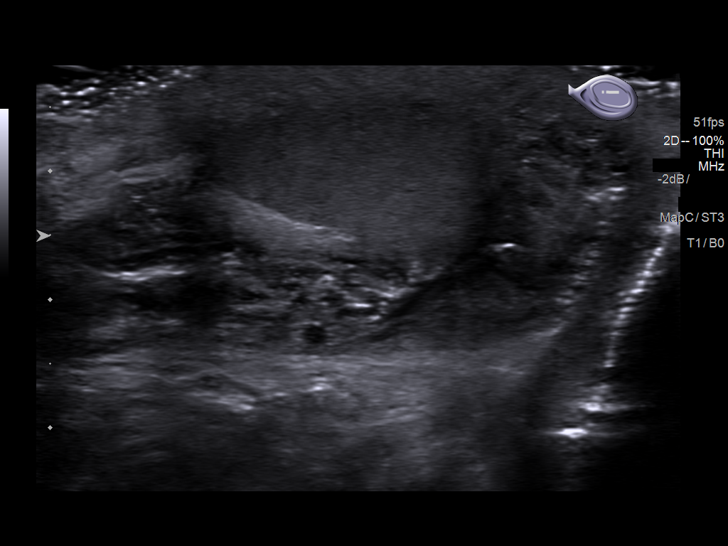
[im 16/46]
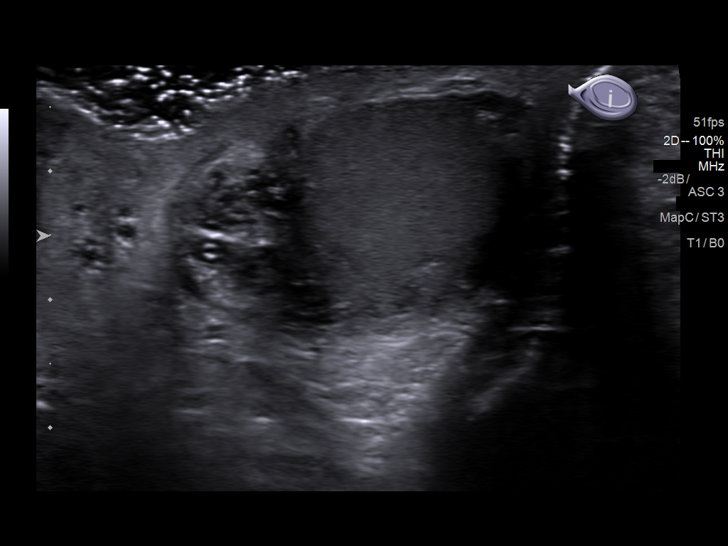
[im 19/46]
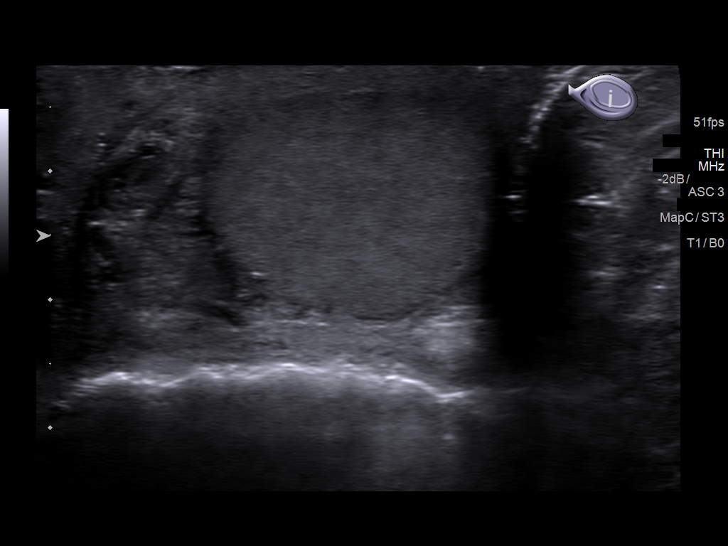
[im 23/46]
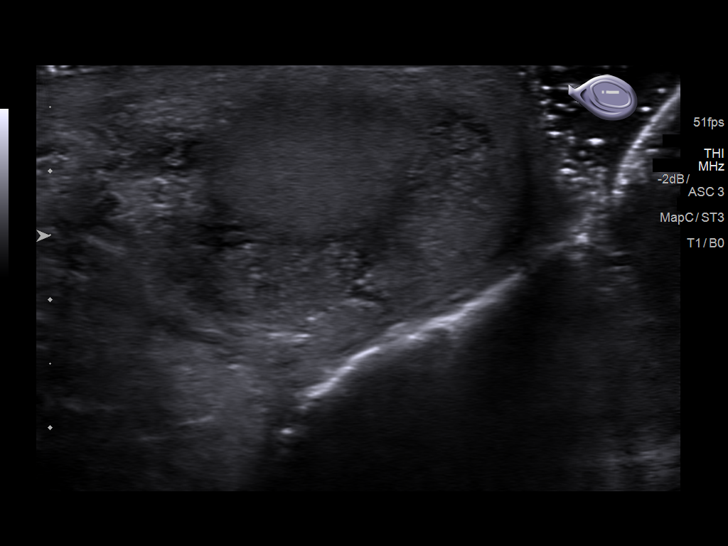
[im 27/46]
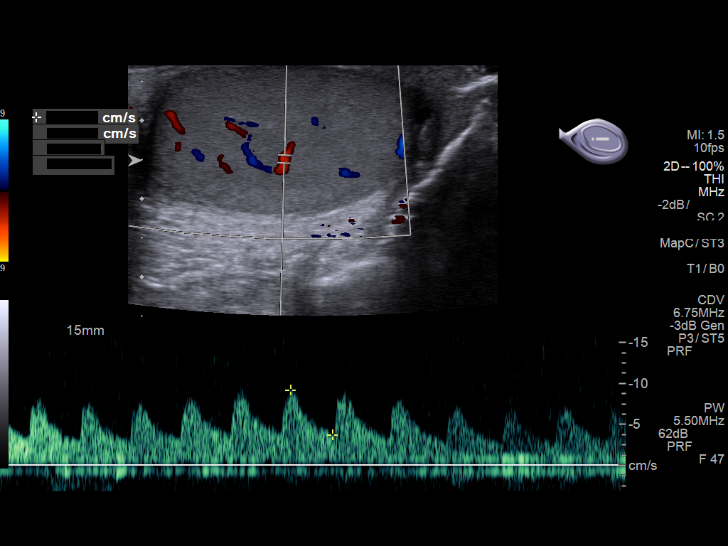
[im 31/46]
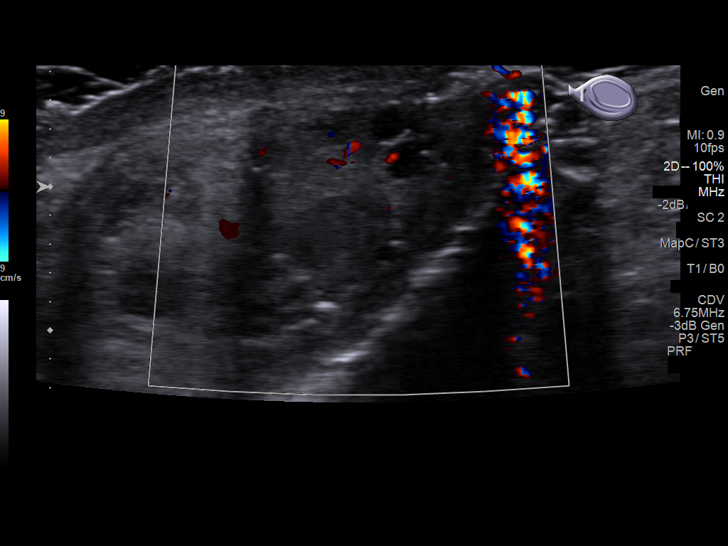
[im 34/46]
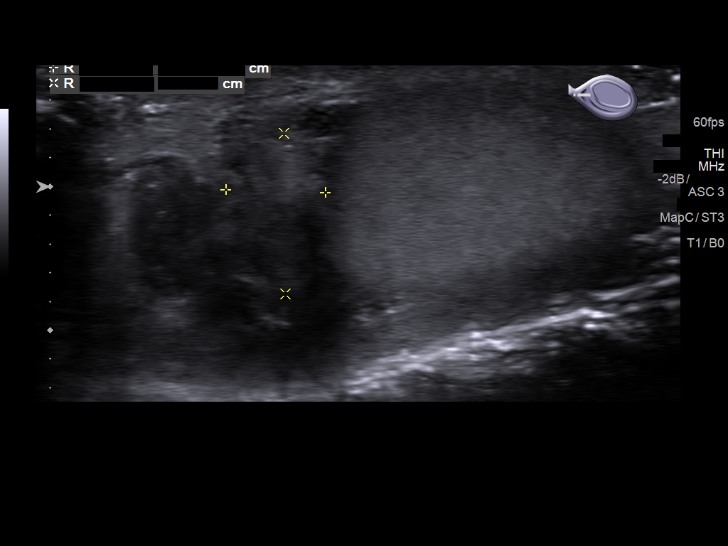
[im 38/46]
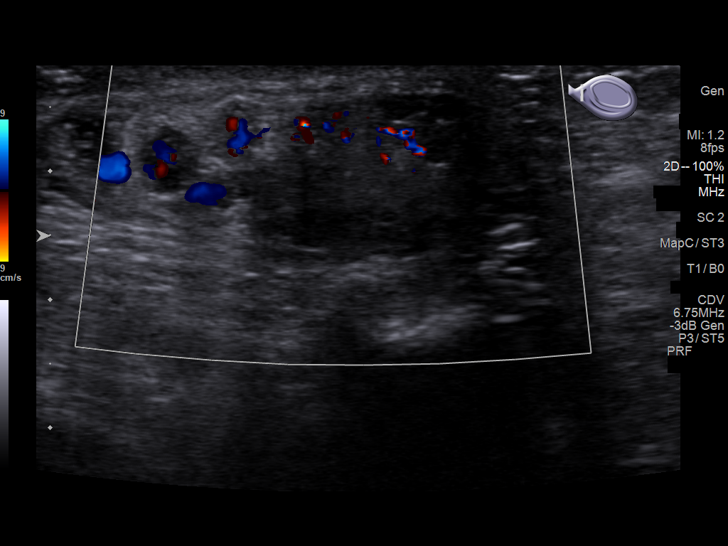
[im 42/46]
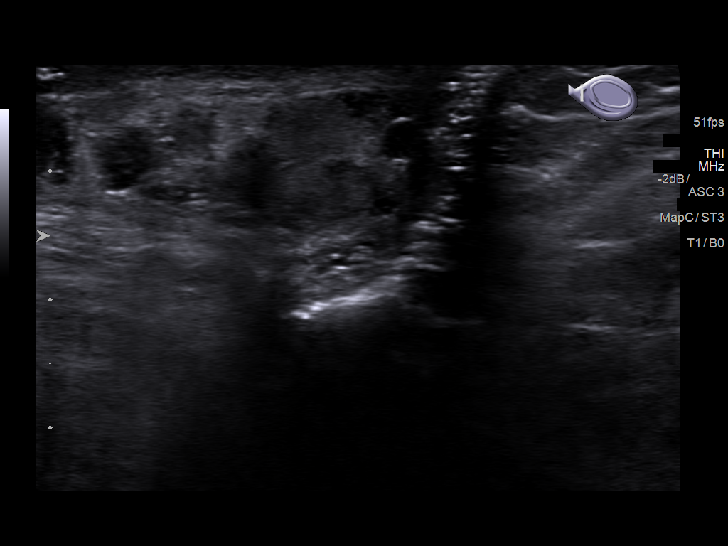
[im 46/46]
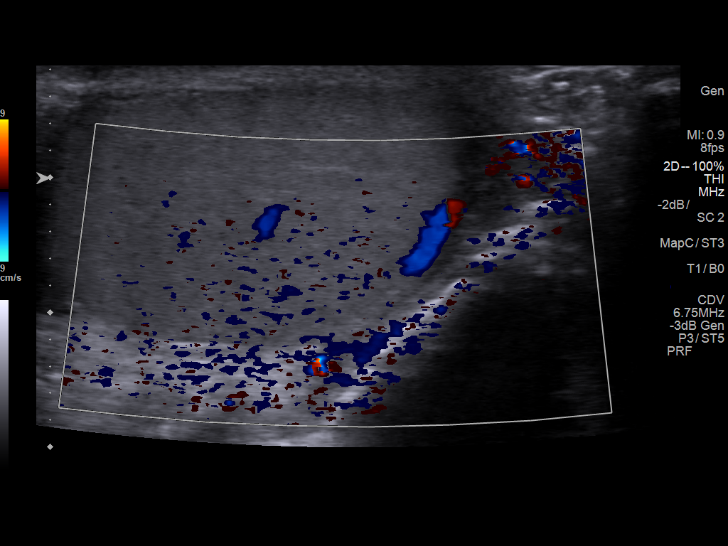

[13 of 25 positions shown; findings below may reference images not displayed]

FINDINGS: Right testicle

Measurements: 3.5 x 2.1 x 2.8 cm. No mass or microlithiasis
visualized.

Left testicle

Measurements: 3.4 x 1.9 x 2.6 cm. No mass or microlithiasis
visualized.

Right epididymis: Normal in size and appearance. Incidental note
made of a 2.7 mm simple cyst at the right epididymal head. This is
of doubtful significance.

Left epididymis: Normal in size and appearance. Few small epididymal
cysts measuring approximately 3 mm present at the left epididymal
head, also of doubtful significance.

Hydrocele:  None visualized.

Varicocele:  None visualized.

Pulsed Doppler interrogation of both testes demonstrates normal low
resistance arterial and venous waveforms bilaterally.
IMPRESSION: Negative testicular ultrasound. No acute abnormality identified. No
evidence for torsion.

## 2019-03-02 ENCOUNTER — Other Ambulatory Visit: Payer: Self-pay | Admitting: Family Medicine

## 2019-03-02 MED ORDER — ESTRADIOL VALERATE 20 MG/ML IM OIL: TOPICAL_OIL | INTRAMUSCULAR | 0 refills | Status: DC

## 2019-06-23 ENCOUNTER — Other Ambulatory Visit: Payer: Self-pay | Admitting: Family Medicine

## 2019-06-26 MED ORDER — ESTRADIOL VALERATE 20 MG/ML IM OIL: TOPICAL_OIL | INTRAMUSCULAR | 0 refills | Status: DC

## 2019-08-08 ENCOUNTER — Other Ambulatory Visit: Payer: Self-pay

## 2019-08-09 MED ORDER — ONDANSETRON 8 MG PO TBDP: 8.0000 mg | ORAL_TABLET | Freq: Three times a day (TID) | ORAL | 0 refills | Status: DC | PRN

## 2019-09-26 ENCOUNTER — Other Ambulatory Visit: Payer: Self-pay | Admitting: Family Medicine

## 2019-09-30 ENCOUNTER — Other Ambulatory Visit: Payer: Self-pay | Admitting: Family Medicine

## 2019-10-25 ENCOUNTER — Other Ambulatory Visit: Payer: Self-pay | Admitting: Family Medicine

## 2019-10-28 ENCOUNTER — Other Ambulatory Visit: Payer: Self-pay | Admitting: Family Medicine

## 2019-10-29 ENCOUNTER — Other Ambulatory Visit: Payer: Self-pay | Admitting: Family Medicine

## 2019-10-29 NOTE — Telephone Encounter (Signed)
Pts next dose is due tomorrow and is requesting a refill  Jone Baseman, CMA

## 2019-10-29 NOTE — Telephone Encounter (Signed)
Pt is calling to check the status of her medication refill on Estradiol valerate. jw.

## 2019-10-30 MED ORDER — ESTRADIOL VALERATE 20 MG/ML IM OIL: TOPICAL_OIL | INTRAMUSCULAR | 0 refills | Status: DC

## 2019-10-30 NOTE — Telephone Encounter (Signed)
Patient calling again about this refill and is very unhappy with how long this has taken. Patient has to take this medication today. Please advise.

## 2019-10-31 ENCOUNTER — Other Ambulatory Visit: Payer: Self-pay

## 2019-10-31 ENCOUNTER — Ambulatory Visit (INDEPENDENT_AMBULATORY_CARE_PROVIDER_SITE_OTHER): Payer: Self-pay | Admitting: Family Medicine

## 2019-10-31 ENCOUNTER — Encounter: Payer: Self-pay | Admitting: Family Medicine

## 2019-10-31 VITALS — BP 118/76 | HR 86 | Ht 67.0 in | Wt 124.4 lb

## 2019-10-31 DIAGNOSIS — F640 Transsexualism: Secondary | ICD-10-CM

## 2019-10-31 DIAGNOSIS — R634 Abnormal weight loss: Secondary | ICD-10-CM

## 2019-10-31 DIAGNOSIS — Z789 Other specified health status: Secondary | ICD-10-CM

## 2019-10-31 NOTE — Assessment & Plan Note (Signed)
Reviewed weights both on her chart and on hours.  Overall since 2018 she is down about 10 pounds.  We will check TSH.  Otherwise I am not too concerned as there are no other systemic symptoms.  She is reassured.

## 2019-10-31 NOTE — Assessment & Plan Note (Signed)
We will get labs.  She just did her injection of estradiol yesterday so it may be a little high with as we will be getting the peak concentration.  We will have to take this into consideration with the lab work.

## 2019-10-31 NOTE — Patient Instructions (Signed)
Great to see you!   

## 2019-10-31 NOTE — Progress Notes (Signed)
    CHIEF COMPLAINT / HPI: Follow-up for hormone therapy and discussion about weight loss.  She brings a graph of weight over the last 2 years.  Prior to starting hormone therapy she was in the 130 range.  She thinks her typical weight is around 1 24-1 28.  Had an episode where she felt sick for a few days and was not eating as well but that resolved about 3 weeks ago.  Since then she thinks she has been eating normally in the way has not come back on.  No other symptoms of concern, specifically no unusual tiredness, no hair loss, no unusual sweats, no temperature insensitivity or skin changes.  2.  Hormone therapy for gender dysphoria.  Currently only taking the injectable estrogen and pretty happy with the results.  Continues to have breast development.  Stopped taking the spironolactone sometime ago.   PERTINENT  PMH / PSH: I have reviewed the patient's medications, allergies, past medical and surgical history, smoking status and updated in the EMR as appropriate.   OBJECTIVE:  BP 118/76   Pulse 86   Ht 5\' 7"  (1.702 m)   Wt 124 lb 6.4 oz (56.4 kg)   SpO2 100%   BMI 19.48 kg/m  Vital signs reviewed. GENERAL: Well-developed, well-nourished, no acute distress. CARDIOVASCULAR: Regular rate and rhythm no murmur gallop or rub LUNGS: Clear to auscultation bilaterally, no rales or wheeze. ABDOMEN: Soft positive bowel sounds NEURO: No gross focal neurological deficits. MSK: Movement of extremity x 4.\ PSYCH: AxOx4. Good eye contact.. No psychomotor retardation or agitation. Appropriate speech fluency and content. Asks and answers questions appropriately. Mood is congruent. NECK: No lymphadenopathy, supple, no thyromegaly.    ASSESSMENT / PLAN:   Male-to-male transgender person We will get labs.  She just did her injection of estradiol yesterday so it may be a little high with as we will be getting the peak concentration.  We will have to take this into consideration with the lab  work.  Weight loss Reviewed weights both on her chart and on hours.  Overall since 2018 she is down about 10 pounds.  We will check TSH.  Otherwise I am not too concerned as there are no other systemic symptoms.  She is reassured.   2019 MD

## 2019-11-01 ENCOUNTER — Encounter: Payer: Self-pay | Admitting: Family Medicine

## 2019-11-01 LAB — COMPREHENSIVE METABOLIC PANEL
ALT: 19 IU/L (ref 0–44)
AST: 20 IU/L (ref 0–40)
Albumin/Globulin Ratio: 2.3 — ABNORMAL HIGH (ref 1.2–2.2)
Albumin: 4.5 g/dL (ref 4.1–5.2)
Alkaline Phosphatase: 66 IU/L (ref 39–117)
BUN/Creatinine Ratio: 8 — ABNORMAL LOW (ref 9–20)
BUN: 6 mg/dL (ref 6–20)
Bilirubin Total: 0.2 mg/dL (ref 0.0–1.2)
CO2: 22 mmol/L (ref 20–29)
Calcium: 9.5 mg/dL (ref 8.7–10.2)
Chloride: 104 mmol/L (ref 96–106)
Creatinine, Ser: 0.73 mg/dL — ABNORMAL LOW (ref 0.76–1.27)
GFR calc Af Amer: 145 mL/min/{1.73_m2} (ref >59)
GFR calc non Af Amer: 125 mL/min/{1.73_m2} (ref >59)
Globulin, Total: 2 g/dL (ref 1.5–4.5)
Glucose: 85 mg/dL (ref 65–99)
Potassium: 4 mmol/L (ref 3.5–5.2)
Sodium: 139 mmol/L (ref 134–144)
Total Protein: 6.5 g/dL (ref 6.0–8.5)

## 2019-11-01 LAB — TESTOSTERONE: Testosterone: 7 ng/dL — ABNORMAL LOW (ref 264–916)

## 2019-11-01 LAB — ESTRADIOL: Estradiol: 1019 pg/mL — ABNORMAL HIGH (ref 7.6–42.6)

## 2019-11-01 LAB — TSH: TSH: 1.74 u[IU]/mL (ref 0.450–4.500)

## 2019-12-07 ENCOUNTER — Ambulatory Visit: Payer: Self-pay | Admitting: Podiatry

## 2020-01-13 ENCOUNTER — Telehealth: Payer: Self-pay | Admitting: Family

## 2020-01-13 DIAGNOSIS — J069 Acute upper respiratory infection, unspecified: Secondary | ICD-10-CM

## 2020-01-13 MED ORDER — BENZONATATE 100 MG PO CAPS: 100.0000 mg | ORAL_CAPSULE | Freq: Three times a day (TID) | ORAL | 0 refills | Status: AC | PRN

## 2020-01-13 MED ORDER — FLUTICASONE PROPIONATE 50 MCG/ACT NA SUSP: 2.0000 | Freq: Every day | NASAL | 6 refills | Status: AC

## 2020-01-13 NOTE — Progress Notes (Signed)
We are sorry you are not feeling well.  Here is how we plan to help!  Based on what you have shared with me, it looks like you may have a viral upper respiratory infection.  Upper respiratory infections are caused by a large number of viruses; however, rhinovirus is the most common cause.   Symptoms vary from person to person, with common symptoms including sore throat, cough, fatigue or lack of energy and feeling of general discomfort.  A low-grade fever of up to 100.4 may present, but is often uncommon.  Symptoms vary however, and are closely related to a person's age or underlying illnesses.  The most common symptoms associated with an upper respiratory infection are nasal discharge or congestion, cough, sneezing, headache and pressure in the ears and face.  These symptoms usually persist for about 3 to 10 days, but can last up to 2 weeks.  It is important to know that upper respiratory infections do not cause serious illness or complications in most cases.    Upper respiratory infections can be transmitted from person to person, with the most common method of transmission being a person's hands.  The virus is able to live on the skin and can infect other persons for up to 2 hours after direct contact.  Also, these can be transmitted when someone coughs or sneezes; thus, it is important to cover the mouth to reduce this risk.  To keep the spread of the illness at bay, good hand hygiene is very important.  This is an infection that is most likely caused by a virus. There are no specific treatments other than to help you with the symptoms until the infection runs its course.  We are sorry you are not feeling well.  Here is how we plan to help!   For nasal congestion, you may use an oral decongestants such as Mucinex D or if you have glaucoma or high blood pressure use plain Mucinex.  Saline nasal spray or nasal drops can help and can safely be used as often as needed for congestion.  For your congestion,  I have prescribed Fluticasone nasal spray one spray in each nostril twice a day  If you do not have a history of heart disease, hypertension, diabetes or thyroid disease, prostate/bladder issues or glaucoma, you may also use Sudafed to treat nasal congestion.  It is highly recommended that you consult with a pharmacist or your primary care physician to ensure this medication is safe for you to take.     If you have a cough, you may use cough suppressants such as Delsym and Robitussin.  If you have glaucoma or high blood pressure, you can also use Coricidin HBP.   For cough I have prescribed for you A prescription cough medication called Tessalon Perles 100 mg. You may take 1-2 capsules every 8 hours as needed for cough.   If you have not been tested for Covid you need to do this to rule this out.  If you have a sore or scratchy throat, use a saltwater gargle-  to  teaspoon of salt dissolved in a 4-ounce to 8-ounce glass of warm water.  Gargle the solution for approximately 15-30 seconds and then spit.  It is important not to swallow the solution.  You can also use throat lozenges/cough drops and Chloraseptic spray to help with throat pain or discomfort.  Warm or cold liquids can also be helpful in relieving throat pain.  For headache, pain or general discomfort, you  can use Ibuprofen or Tylenol as directed.   Some authorities believe that zinc sprays or the use of Echinacea may shorten the course of your symptoms.   HOME CARE . Only take medications as instructed by your medical team. . Be sure to drink plenty of fluids. Water is fine as well as fruit juices, sodas and electrolyte beverages. You may want to stay away from caffeine or alcohol. If you are nauseated, try taking small sips of liquids. How do you know if you are getting enough fluid? Your urine should be a pale yellow or almost colorless. . Get rest. . Taking a steamy shower or using a humidifier may help nasal congestion and ease  sore throat pain. You can place a towel over your head and breathe in the steam from hot water coming from a faucet. . Using a saline nasal spray works much the same way. . Cough drops, hard candies and sore throat lozenges may ease your cough. . Avoid close contacts especially the very young and the elderly . Cover your mouth if you cough or sneeze . Always remember to wash your hands.   GET HELP RIGHT AWAY IF: . You develop worsening fever. . If your symptoms do not improve within 10 days . You develop yellow or green discharge from your nose over 3 days. . You have coughing fits . You develop a severe head ache or visual changes. . You develop shortness of breath, difficulty breathing or start having chest pain . Your symptoms persist after you have completed your treatment plan  MAKE SURE YOU   Understand these instructions.  Will watch your condition.  Will get help right away if you are not doing well or get worse.  Your e-visit answers were reviewed by a board certified advanced clinical practitioner to complete your personal care plan. Depending upon the condition, your plan could have included both over the counter or prescription medications. Please review your pharmacy choice. If there is a problem, you may call our nursing hot line at and have the prescription routed to another pharmacy. Your safety is important to [place]. If you have drug allergies check your prescription carefully.   You can use MyChart to ask questions about today's visit, request a non-urgent call back, or ask for a work or school excuse for 24 hours related to this e-Visit. If it has been greater than 24 hours you will need to follow up with your provider, or enter a new e-Visit to address those concerns. You will get an e-mail in the next two days asking about your experience.  I hope that your e-visit has been valuable and will speed your recovery. Thank you for using e-visits.   Approximately 5 minutes  was spent documenting and reviewing patient's chart.

## 2020-02-05 ENCOUNTER — Other Ambulatory Visit: Payer: Self-pay | Admitting: Family Medicine

## 2020-02-06 MED ORDER — ESTRADIOL VALERATE 20 MG/ML IM OIL: TOPICAL_OIL | INTRAMUSCULAR | 4 refills | Status: AC

## 2020-11-28 ENCOUNTER — Other Ambulatory Visit: Payer: Self-pay

## 2020-11-28 NOTE — Telephone Encounter (Signed)
Patient calls nurse line regarding rx refill on estradiol. Patient is requesting additional vials to be sent to pharmacy, as patient is moving to [place]. Patient reports limited access to transgender healthcare in this state and would like to have enough medication to last until pt can become established with new doctor.   Please advise.   Veronda Prude, RN

## 2020-12-04 NOTE — Telephone Encounter (Signed)
Spoke to pt. Moving in about 10 days. Will not be able to come into the office with so much to do before the move. Wasn't prepared for the cost of an office visit and really doesn't have the money for a visit. I explained Dr. Jennette Kettle may not be able to give medication without a visit. Pt asked if she could just refill one more bottle that would be very appreciated. Please advise. Sunday Spillers, CMA

## 2021-11-24 ENCOUNTER — Encounter: Payer: Self-pay | Admitting: *Deleted
